# Patient Record
Sex: Female | Born: 1990
Health system: Southern US, Community
[De-identification: ages and names within clinical notes are randomized; demographics above are authoritative.]

## PROBLEM LIST (undated history)

## (undated) DIAGNOSIS — T783XXA Angioneurotic edema, initial encounter: Secondary | ICD-10-CM

## (undated) DIAGNOSIS — I10 Essential (primary) hypertension: Secondary | ICD-10-CM

## (undated) DIAGNOSIS — R002 Palpitations: Secondary | ICD-10-CM

## (undated) DIAGNOSIS — E8881 Metabolic syndrome: Secondary | ICD-10-CM

## (undated) DIAGNOSIS — F419 Anxiety disorder, unspecified: Secondary | ICD-10-CM

## (undated) DIAGNOSIS — E538 Deficiency of other specified B group vitamins: Secondary | ICD-10-CM

## (undated) DIAGNOSIS — R0602 Shortness of breath: Secondary | ICD-10-CM

## (undated) DIAGNOSIS — E88819 Insulin resistance, unspecified: Secondary | ICD-10-CM

## (undated) DIAGNOSIS — E559 Vitamin D deficiency, unspecified: Secondary | ICD-10-CM

## (undated) DIAGNOSIS — K219 Gastro-esophageal reflux disease without esophagitis: Secondary | ICD-10-CM

## (undated) DIAGNOSIS — D229 Melanocytic nevi, unspecified: Secondary | ICD-10-CM

## (undated) DIAGNOSIS — R0789 Other chest pain: Secondary | ICD-10-CM

## (undated) HISTORY — DX: Insulin resistance, unspecified: E88.819

## (undated) HISTORY — PX: TONSILLECTOMY: SUR1361

## (undated) HISTORY — DX: Palpitations: R00.2

## (undated) HISTORY — DX: Angioneurotic edema, initial encounter: T78.3XXA

## (undated) HISTORY — DX: Other chest pain: R07.89

## (undated) HISTORY — PX: SKIN SURGERY: SHX2413

## (undated) HISTORY — PX: MYRINGOTOMY: SUR874

## (undated) HISTORY — DX: Metabolic syndrome: E88.81

## (undated) HISTORY — DX: Essential (primary) hypertension: I10

## (undated) HISTORY — DX: Shortness of breath: R06.02

## (undated) HISTORY — PX: FINGER SURGERY: SHX640

## (undated) HISTORY — PX: ADENOIDECTOMY: SUR15

## (undated) HISTORY — DX: Melanocytic nevi, unspecified: D22.9

## (undated) HISTORY — DX: Vitamin D deficiency, unspecified: E55.9

## (undated) HISTORY — DX: Deficiency of other specified B group vitamins: E53.8

## (undated) HISTORY — DX: Anxiety disorder, unspecified: F41.9

---
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
## 2013-09-12 DIAGNOSIS — D229 Melanocytic nevi, unspecified: Secondary | ICD-10-CM

## 2013-09-12 HISTORY — DX: Melanocytic nevi, unspecified: D22.9

## 2013-10-16 ENCOUNTER — Encounter: Payer: Self-pay | Admitting: Gastroenterology

## 2013-12-01 ENCOUNTER — Ambulatory Visit: Payer: Self-pay | Admitting: Gastroenterology

## 2015-04-07 ENCOUNTER — Emergency Department (HOSPITAL_COMMUNITY)
Admission: EM | Admit: 2015-04-07 | Discharge: 2015-04-08 | Disposition: A | Discharge status: Home / Self Care | Payer: BLUE CROSS/BLUE SHIELD | Attending: Emergency Medicine | Admitting: Emergency Medicine

## 2015-04-07 ENCOUNTER — Emergency Department (HOSPITAL_COMMUNITY): Payer: BLUE CROSS/BLUE SHIELD

## 2015-04-07 ENCOUNTER — Encounter (HOSPITAL_COMMUNITY): Payer: Self-pay | Admitting: *Deleted

## 2015-04-07 DIAGNOSIS — Z3202 Encounter for pregnancy test, result negative: Secondary | ICD-10-CM | POA: Insufficient documentation

## 2015-04-07 DIAGNOSIS — Z79899 Other long term (current) drug therapy: Secondary | ICD-10-CM | POA: Insufficient documentation

## 2015-04-07 DIAGNOSIS — I10 Essential (primary) hypertension: Secondary | ICD-10-CM | POA: Diagnosis not present

## 2015-04-07 DIAGNOSIS — R0602 Shortness of breath: Secondary | ICD-10-CM | POA: Diagnosis present

## 2015-04-07 DIAGNOSIS — N390 Urinary tract infection, site not specified: Secondary | ICD-10-CM | POA: Insufficient documentation

## 2015-04-07 DIAGNOSIS — R000 Tachycardia, unspecified: Secondary | ICD-10-CM | POA: Diagnosis not present

## 2015-04-07 DIAGNOSIS — Z8639 Personal history of other endocrine, nutritional and metabolic disease: Secondary | ICD-10-CM | POA: Insufficient documentation

## 2015-04-07 DIAGNOSIS — R05 Cough: Secondary | ICD-10-CM | POA: Insufficient documentation

## 2015-04-07 DIAGNOSIS — R079 Chest pain, unspecified: Secondary | ICD-10-CM | POA: Diagnosis not present

## 2015-04-07 LAB — CBC
HEMATOCRIT: 39 % (ref 36.0–46.0)
HEMOGLOBIN: 13.2 g/dL (ref 12.0–15.0)
MCH: 27.5 pg (ref 26.0–34.0)
MCHC: 33.8 g/dL (ref 30.0–36.0)
MCV: 81.3 fL (ref 78.0–100.0)
Platelets: 239 10*3/uL (ref 150–400)
RBC: 4.8 MIL/uL (ref 3.87–5.11)
RDW: 12.6 % (ref 11.5–15.5)
WBC: 9.9 10*3/uL (ref 4.0–10.5)

## 2015-04-07 LAB — BASIC METABOLIC PANEL
Anion gap: 14 (ref 5–15)
BUN: 8 mg/dL (ref 6–20)
CHLORIDE: 103 mmol/L (ref 101–111)
CO2: 19 mmol/L — AB (ref 22–32)
Calcium: 9.6 mg/dL (ref 8.9–10.3)
Creatinine, Ser: 0.73 mg/dL (ref 0.44–1.00)
GFR calc non Af Amer: >60 mL/min (ref >60)
Glucose, Bld: 91 mg/dL (ref 65–99)
POTASSIUM: 3.7 mmol/L (ref 3.5–5.1)
SODIUM: 136 mmol/L (ref 135–145)

## 2015-04-07 LAB — I-STAT TROPONIN, ED
TROPONIN I, POC: 0 ng/mL (ref 0.00–0.08)
Troponin i, poc: 0 ng/mL (ref 0.00–0.08)

## 2015-04-07 LAB — POC URINE PREG, ED: Preg Test, Ur: NEGATIVE

## 2015-04-07 NOTE — ED Notes (Signed)
Patient presents stating she stated yesterday feeling like she could not catch her breath.  Stated she could walk to the car but then was SOB.  Also stated she felt tightness in her chest

## 2015-04-07 NOTE — ED Notes (Signed)
Patient does not want family to know if she is pregnant.  PLEASE do not announce with family in the room.  States she takes BCP and they use other protection but wants to make sure no one is told if she is pregnant

## 2015-04-08 ENCOUNTER — Emergency Department (HOSPITAL_COMMUNITY): Payer: BLUE CROSS/BLUE SHIELD

## 2015-04-08 LAB — ACETAMINOPHEN LEVEL: Acetaminophen (Tylenol), Serum: <10 ug/mL — ABNORMAL LOW (ref 10–30)

## 2015-04-08 LAB — URINALYSIS, ROUTINE W REFLEX MICROSCOPIC
BILIRUBIN URINE: NEGATIVE
Glucose, UA: NEGATIVE mg/dL
HGB URINE DIPSTICK: NEGATIVE
Ketones, ur: 15 mg/dL — AB
Nitrite: NEGATIVE
PH: 6 (ref 5.0–8.0)
Protein, ur: NEGATIVE mg/dL
SPECIFIC GRAVITY, URINE: 1.02 (ref 1.005–1.030)
UROBILINOGEN UA: 0.2 mg/dL (ref 0.0–1.0)

## 2015-04-08 LAB — URINE MICROSCOPIC-ADD ON

## 2015-04-08 LAB — SALICYLATE LEVEL

## 2015-04-08 MED ORDER — CEPHALEXIN 500 MG PO CAPS: 500.0000 mg | ORAL_CAPSULE | Freq: Two times a day (BID) | ORAL | Status: DC

## 2015-04-08 MED ORDER — IOHEXOL 350 MG/ML SOLN
100.0000 mL | Freq: Once | INTRAVENOUS | Status: AC | PRN
  Administered 2015-04-08: 100 mL via INTRAVENOUS

## 2015-04-08 MED ORDER — SODIUM CHLORIDE 0.9 % IV BOLUS (SEPSIS)
1000.0000 mL | Freq: Once | INTRAVENOUS | Status: AC
  Administered 2015-04-08: 1000 mL via INTRAVENOUS

## 2015-04-08 NOTE — Discharge Instructions (Signed)
Urinary Tract Infection Summer Flores, your blood work, EKG, and CT scan today did not show a cause for your symptoms.  Your heart rate and blood pressure improved with IV fluids.  You may be dehydrated.  Be sure to drink plenty of water everyday, especially in the heat. Take anti-biotic as directed for urine infection. See a primary care physician within 3 days for close follow-up. If symptoms worsen come back to emergency department immediately. Thank you. A urinary tract infection (UTI) can occur any place along the urinary tract. The tract includes the kidneys, ureters, bladder, and urethra. A type of germ called bacteria often causes a UTI. UTIs are often helped with antibiotic medicine.  HOME CARE   If given, take antibiotics as told by your doctor. Finish them even if you start to feel better.  Drink enough fluids to keep your pee (urine) clear or pale yellow.  Avoid tea, drinks with caffeine, and bubbly (carbonated) drinks.  Pee often. Avoid holding your pee in for a long time.  Pee before and after having sex (intercourse).  Wipe from front to back after you poop (bowel movement) if you are a woman. Use each tissue only once. GET HELP RIGHT AWAY IF:   You have back pain.  You have lower belly (abdominal) pain.  You have chills.  You feel sick to your stomach (nauseous).  You throw up (vomit).  Your burning or discomfort with peeing does not go away.  You have a fever.  Your symptoms are not better in 3 days. MAKE SURE YOU:   Understand these instructions.  Will watch your condition.  Will get help right away if you are not doing well or get worse. Document Released: 01/03/2008 Document Revised: 04/10/2012 Document Reviewed: 02/15/2012 [facility] Patient Information 2015 [place], [place]. This information is not intended to replace advice given to you by your health care provider. Make sure you discuss any questions you have with your health care provider. Chest Pain  (Nonspecific) It is often hard to give a diagnosis for the cause of chest pain. There is always a chance that your pain could be related to something serious, such as a heart attack or a blood clot in the lungs. You need to follow up with your doctor. HOME CARE  If antibiotic medicine was given, take it as directed by your doctor. Finish the medicine even if you start to feel better.  For the next few days, avoid activities that bring on chest pain. Continue physical activities as told by your doctor.  Do not use any tobacco products. This includes cigarettes, chewing tobacco, and e-cigarettes.  Avoid drinking alcohol.  Only take medicine as told by your doctor.  Follow your doctor's suggestions for more testing if your chest pain does not go away.  Keep all doctor visits you made. GET HELP IF:  Your chest pain does not go away, even after treatment.  You have a rash with blisters on your chest.  You have a fever. GET HELP RIGHT AWAY IF:   You have more pain or pain that spreads to your arm, neck, jaw, back, or belly (abdomen).  You have shortness of breath.  You cough more than usual or cough up blood.  You have very bad back or belly pain.  You feel sick to your stomach (nauseous) or throw up (vomit).  You have very bad weakness.  You pass out (faint).  You have chills. This is an emergency. Do not wait to see if  the problems will go away. Call your local emergency services (911 in [place] Do not drive yourself to the hospital. MAKE SURE YOU:   Understand these instructions.  Will watch your condition.  Will get help right away if you are not doing well or get worse. Document Released: 01/03/2008 Document Revised: 07/22/2013 Document Reviewed: 01/03/2008 [facility] Patient Information 2015 [place], [place]. This information is not intended to replace advice given to you by your health care provider. Make sure you discuss any questions you have with your health care  provider.

## 2015-04-08 NOTE — ED Provider Notes (Signed)
CSN: [id number]     Arrival date & time 04/07/15  2051 History  This chart was scribed for Summer Balls, MD by Evelene Croon, ED Scribe. This patient was seen in room D35C/D35C and the patient's care was started 12:10 AM.    Chief Complaint  Patient presents with  . Shortness of Breath  . Chest Tightness     The history is provided by the patient.     HPI Comments:  MAHALA Flores is a 24 y.o. female with a history of HTN who presents to the Emergency Department complaining of central chest tightness for 2 days with associated dry cough, intermittent SOB, swelling to her BLE. Pt wears a fitbit and states her HR increases to 120s during these episodes. She denies fever, sore throat, and rhinorrhea. She denies recent surgery and recent travel but note 12 hour drive in may 6568. Pt is currently on BCP . No alleviating factors noted.   Past Medical History  Diagnosis Date  . Insulin resistance   . Hypertension    Past Surgical History  Procedure Laterality Date  . Tonsillectomy    . Finger surgery    . Myringotomy     No family history on file. Social History  Substance Use Topics  . Smoking status: Never Smoker   . Smokeless tobacco: Never Used  . Alcohol Use: Yes     Comment: socially   OB History    No data available     Review of Systems  A complete 10 system review of systems was obtained and all systems are negative except as noted in the HPI and PMH.    Allergies  Review of patient's allergies indicates no known allergies.  Home Medications   Prior to Admission medications   Medication Sig Start Date End Date Taking? Authorizing Provider  losartan (COZAAR) 100 MG tablet Take 100 mg by mouth daily.   Yes Historical Provider, MD  norethindrone-ethinyl estradiol 1/35 (ORTHO-NOVUM, NORTREL,CYCLAFEM) tablet Take 1 tablet by mouth daily.   Yes Historical Provider, MD   BP 143/90 mmHg  Pulse 114  Temp(Src) 98.7 F (37.1 C) (Oral)  Resp 17  Ht 5\' 6"  (1.676 m)  Wt  231 lb 3.2 oz (104.872 kg)  BMI 37.33 kg/m2  SpO2 100%  LMP 03/28/2015 Physical Exam  Constitutional: She is oriented to person, place, and time. She appears well-developed and well-nourished. No distress.  HENT:  Head: Normocephalic and atraumatic.  Nose: Nose normal.  Mouth/Throat: Oropharynx is clear and moist. No oropharyngeal exudate.  Eyes: Conjunctivae and EOM are normal. Pupils are equal, round, and reactive to light. No scleral icterus.  Neck: Normal range of motion. Neck supple. No JVD present. No tracheal deviation present. No thyromegaly present.  Cardiovascular: Regular rhythm and normal heart sounds.  Tachycardia present.  Exam reveals no gallop and no friction rub.   No murmur heard. Pulmonary/Chest: Effort normal and breath sounds normal. No respiratory distress. She has no wheezes. She exhibits no tenderness.  Abdominal: Soft. Bowel sounds are normal. She exhibits no distension and no mass. There is no tenderness. There is no rebound and no guarding.  Musculoskeletal: Normal range of motion. She exhibits no edema or tenderness.  Lymphadenopathy:    She has no cervical adenopathy.  Neurological: She is alert and oriented to person, place, and time. No cranial nerve deficit. She exhibits normal muscle tone.  Skin: Skin is warm and dry. No rash noted. No erythema. No pallor.  Nursing note and vitals  reviewed.   ED Course  Procedures   DIAGNOSTIC STUDIES:  Oxygen Saturation is 100% on RA, normal by my interpretation.    COORDINATION OF CARE:  12:15 AM Discussed treatment plan with pt at bedside and pt agreed to plan.  Labs Review Labs Reviewed  BASIC METABOLIC PANEL - Abnormal; Notable for the following:    CO2 19 (*)    All other components within normal limits  URINALYSIS, ROUTINE W REFLEX MICROSCOPIC (NOT AT [facility]) - Abnormal; Notable for the following:    APPearance CLOUDY (*)    Ketones, ur 15 (*)    Leukocytes, UA SMALL (*)    All other components within  normal limits  URINE MICROSCOPIC-ADD ON - Abnormal; Notable for the following:    Squamous Epithelial / LPF MANY (*)    Bacteria, UA MANY (*)    All other components within normal limits  CBC  SALICYLATE LEVEL  ACETAMINOPHEN LEVEL  POC URINE PREG, ED  I-STAT TROPOININ, ED  Randolm Idol, ED    Imaging Review Dg Chest 2 View  04/07/2015   CLINICAL DATA:  24 year old female with chest pain and labored breathing  EXAM: CHEST  2 VIEW  COMPARISON:  None.  FINDINGS: The heart size and mediastinal contours are within normal limits. Both lungs are clear. The visualized skeletal structures are unremarkable.  IMPRESSION: No active cardiopulmonary disease.   Electronically Signed   By: Anner Crete M.D.   On: 04/07/2015 21:28   Ct Angio Chest Pe W/cm &/or Wo Cm  04/08/2015   CLINICAL DATA:  Chest pain, shortness of breath, and tachycardia since yesterday.  EXAM: CT ANGIOGRAPHY CHEST WITH CONTRAST  TECHNIQUE: Multidetector CT imaging of the chest was performed using the standard protocol during bolus administration of intravenous contrast. Multiplanar CT image reconstructions and MIPs were obtained to evaluate the vascular anatomy.  CONTRAST:  122mL OMNIPAQUE IOHEXOL 350 MG/ML SOLN  COMPARISON:  None.  FINDINGS: Technically adequate study with good opacification of the central and segmental pulmonary arteries. No focal filling defects demonstrated. No evidence of significant pulmonary embolus.  Normal heart size. Normal caliber thoracic aorta. No aortic dissection. Great vessel origins are patent. Esophagus is decompressed. No significant lymphadenopathy in the chest.  No focal airspace disease or consolidation in the lungs. No pneumothorax. No pleural effusions. Airways appear patent.  Visualized portions of the upper abdominal organs are grossly unremarkable. No destructive bone lesions.  Review of the MIP images confirms the above findings.  IMPRESSION: No evidence of significant pulmonary embolus. No  evidence of active pulmonary disease.   Electronically Signed   By: Lucienne Capers M.D.   On: 04/08/2015 01:23   I have personally reviewed and evaluated these images and lab results as part of my medical decision-making.   EKG Interpretation   Date/Time:  Wednesday April 07 2015 20:58:20 EDT Ventricular Rate:  131 PR Interval:  116 QRS Duration: 96 QT Interval:  396 QTC Calculation: 584 R Axis:   90 Text Interpretation:  Sinus tachycardia Rightward axis Incomplete right  bundle branch block Abnormal ECG Confirmed by Glynn Octave  [phone]) on 04/08/2015 12:00:49 AM      MDM   Final diagnoses:  None   Patient presents emergency department for chest tightness and shortness of breath. I have concern for pulmonary was in. She is tachycardic on exam and has had tachycardia by history over the past couple of days. In addition she had a recent [address] to [place] in May,  patient also takes birth control and is obese. Will obtain CT scan of the chest for evaluation.  Patient was ordered 1 L IV fluid bolus as well.  CTA was negative for pulmonary embolism. Urine does show a mild infection, will treat to see if there is improvement. Primary care follow-up was advised within 3 days. She appears well in no acute distress, tachycardia has resolved with IV fluids. Education given regarding dehydration. Patient is safe for discharge.  I personally performed the services described in this documentation, which was scribed in my presence. The recorded information has been reviewed and is accurate.    Summer Balls, MD 04/08/15 0157

## 2015-04-08 NOTE — ED Notes (Signed)
Pt. Left with all belongings and refused wheelchair. Discharge instructions were reviewed and all questions were answered.  

## 2016-02-07 ENCOUNTER — Other Ambulatory Visit: Payer: Self-pay | Admitting: Nurse Practitioner

## 2016-02-07 DIAGNOSIS — N644 Mastodynia: Secondary | ICD-10-CM

## 2016-02-14 ENCOUNTER — Ambulatory Visit
Admission: RE | Admit: 2016-02-14 | Discharge: 2016-02-14 | Disposition: A | Discharge status: Home / Self Care | Payer: BLUE CROSS/BLUE SHIELD | Source: Ambulatory Visit | Attending: Nurse Practitioner | Admitting: Nurse Practitioner

## 2016-02-14 DIAGNOSIS — N644 Mastodynia: Secondary | ICD-10-CM

## 2016-05-29 IMAGING — DX DG CHEST 2V
2 series · 2 of 2 positions shown · non-contrast
Comparison: None.

CLINICAL DATA: 24-year-old female with chest pain and labored
breathing

EXAM:
CHEST  2 VIEW

[chest pa]
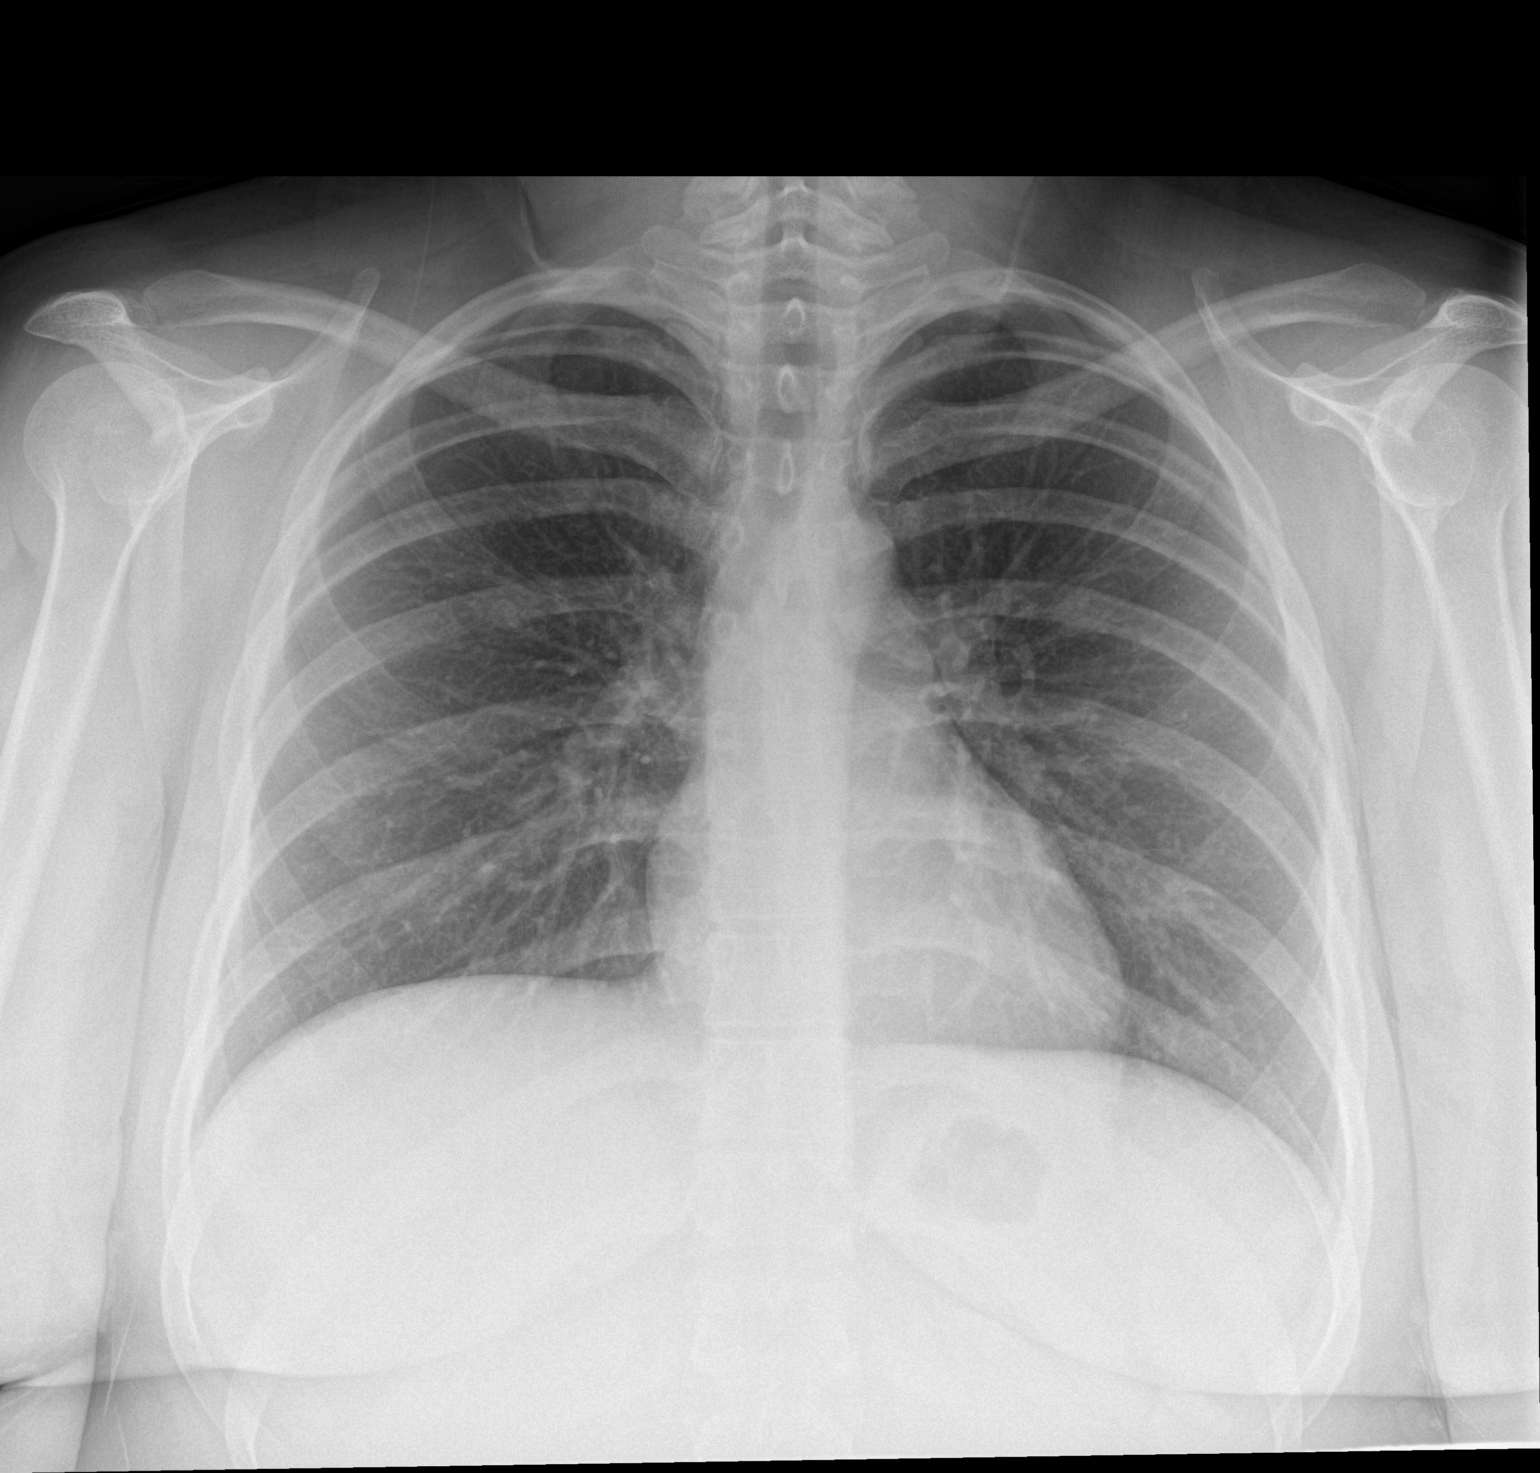

[chest lat]
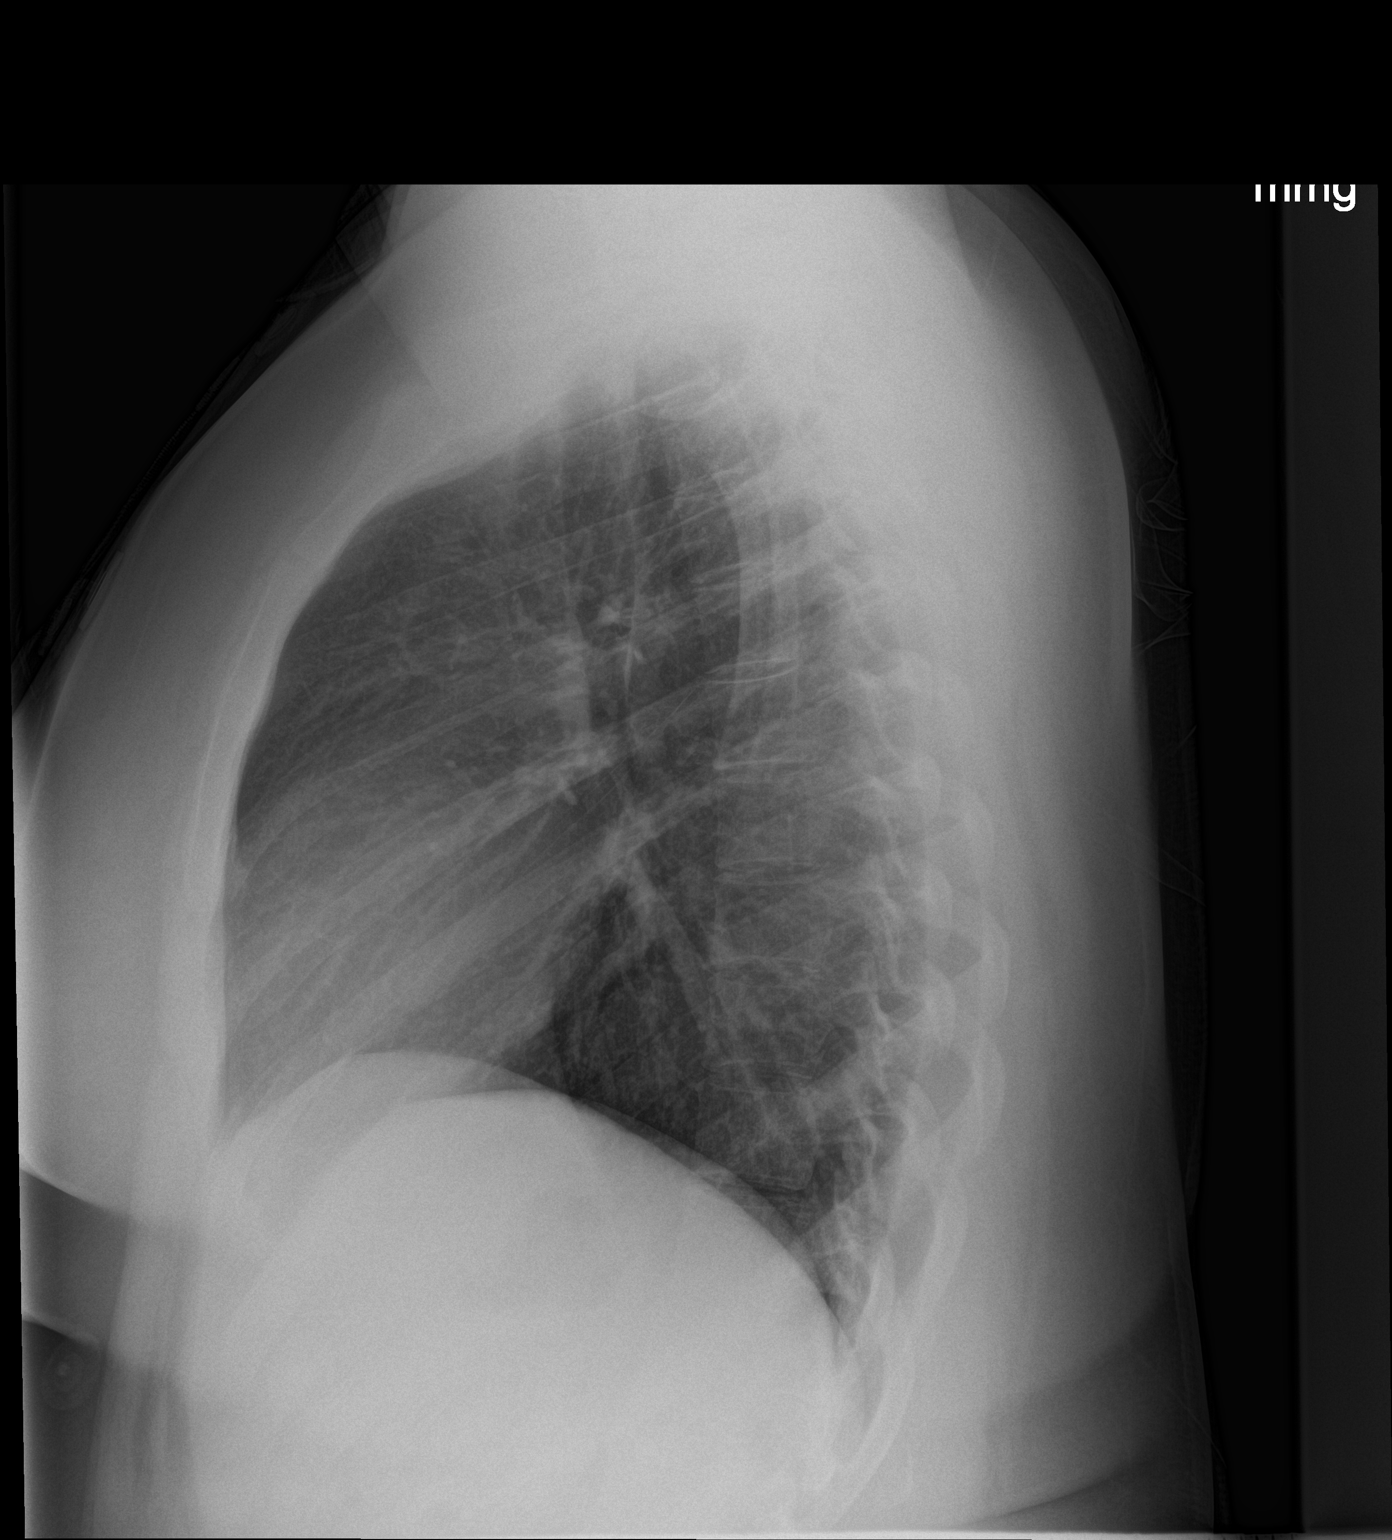

[2 of 2 positions shown; findings below may reference images not displayed]

FINDINGS: The heart size and mediastinal contours are within normal limits.
Both lungs are clear. The visualized skeletal structures are
unremarkable.
IMPRESSION: No active cardiopulmonary disease.

## 2016-08-10 ENCOUNTER — Telehealth: Payer: Self-pay | Admitting: Cardiovascular Disease

## 2016-08-10 NOTE — Telephone Encounter (Signed)
Received records from [facility] for appointment on 09/07/16 with Dr Oval Linsey.  Records put in Dr Blenda Mounts schedule records for 09/07/16. lp

## 2016-09-07 ENCOUNTER — Encounter: Payer: Self-pay | Admitting: Cardiovascular Disease

## 2016-09-07 ENCOUNTER — Ambulatory Visit (INDEPENDENT_AMBULATORY_CARE_PROVIDER_SITE_OTHER): Payer: BLUE CROSS/BLUE SHIELD | Admitting: Cardiovascular Disease

## 2016-09-07 VITALS — BP 128/70 | HR 81 | Ht 66.0 in | Wt 238.2 lb

## 2016-09-07 DIAGNOSIS — I10 Essential (primary) hypertension: Secondary | ICD-10-CM | POA: Diagnosis not present

## 2016-09-07 DIAGNOSIS — R0602 Shortness of breath: Secondary | ICD-10-CM

## 2016-09-07 DIAGNOSIS — R011 Cardiac murmur, unspecified: Secondary | ICD-10-CM | POA: Diagnosis not present

## 2016-09-07 DIAGNOSIS — R0789 Other chest pain: Secondary | ICD-10-CM

## 2016-09-07 NOTE — Patient Instructions (Addendum)
Medication Instructions:  Your physician recommends that you continue on your current medications as directed. Please refer to the Current Medication list given to you today.  Labwork: none  Testing/Procedures: Your physician has requested that you have an echocardiogram. Echocardiography is a painless test that uses sound waves to create images of your heart. It provides your doctor with information about the size and shape of your heart and how well your heart's chambers and valves are working. This procedure takes approximately one hour. There are no restrictions for this procedure. [place]  Your physician has recommended that you have a pulmonary function test. Pulmonary Function Tests are a group of tests that measure how well air moves in and out of your lungs.  Follow-Up: As needed    Echocardiogram An echocardiogram, or echocardiography, uses sound waves (ultrasound) to produce an image of your heart. The echocardiogram is simple, painless, obtained within a short period of time, and offers valuable information to your health care provider. The images from an echocardiogram can provide information such as:  Evidence of coronary artery disease (CAD).  Heart size.  Heart muscle function.  Heart valve function.  Aneurysm detection.  Evidence of a past heart attack.  Fluid buildup around the heart.  Heart muscle thickening.  Assess heart valve function. Tell a health care provider about:  Any allergies you have.  All medicines you are taking, including vitamins, herbs, eye drops, creams, and over-the-counter medicines.  Any problems you or family members have had with anesthetic medicines.  Any blood disorders you have.  Any surgeries you have had.  Any medical conditions you have.  Whether you are pregnant or may be pregnant. What happens before the procedure? No special preparation is needed. Eat and drink normally. What  happens during the procedure?  In order to produce an image of your heart, gel will be applied to your chest and a wand-like tool (transducer) will be moved over your chest. The gel will help transmit the sound waves from the transducer. The sound waves will harmlessly bounce off your heart to allow the heart images to be captured in real-time motion. These images will then be recorded.  You may need an IV to receive a medicine that improves the quality of the pictures. What happens after the procedure? You may return to your normal schedule including diet, activities, and medicines, unless your health care provider tells you otherwise. This information is not intended to replace advice given to you by your health care provider. Make sure you discuss any questions you have with your health care provider. Document Released: 07/14/2000 Document Revised: 03/04/2016 Document Reviewed: 03/24/2013 Elsevier Interactive Patient Education  2017 [employer]   Pulmonary Function Tests Pulmonary function tests (PFTs) measure how well your lungs are working. The tests can help to identify the causes of lung problems. They can also help your health care provider select the best treatment for you. Your health care provider may order pulmonary function for any of the following reasons:  When an illness involving the lungs is suspected.  To follow changes in your lung function over time if you are known to have a chronic lung disease.  For industrial plant workers to examine the effects of being exposed to chemicals over a long period of time.  To assess lung function prior to surgery or other procedures.  For people who are smokers. Your measured lung function will be compared to the expected lung  function of someone with healthy lungs who is similar to you in age, gender, size, and other factors. This is used to determine your "percent predicted" lung function, which is how your health care provider  knows if your lung function is normal or abnormal. If you have had prior pulmonary function testing performed, your health care provider will also compare your current results with past tests to see if your lung function is better, worse, or staying the same. This can sometimes be useful to see if treatments are working.  LET [facility] CARE PROVIDER KNOW ABOUT:  Any allergies you have.  All medicines you are taking, including inhaler or nebulizer medicines, vitamins, herbs, eye drops, creams, and over-the-counter medicines.  Any blood disorders you have.  Previous surgeries you have had, especially recent eye surgery, abdominal surgery, or chest surgery. These can make performing pulmonary function tests difficult or unsafe.  Medical conditions you have.  Chest pain or heart problems.  Tuberculosis or respiratory infections, such as pneumonia, a cold, or the flu. If you think you will have difficulty performing any of the breathing maneuvers, ask your health care provider if you should reschedule the test. RISKS AND COMPLICATIONS: Generally, pulmonary function testing is a safe procedure. However, as with any procedure, complications can occur. Possible complications include:  Lightheadedness due to overbreathing (hyperventilation).  An asthmatic attack from deep breathing. BEFORE THE PROCEDURE  Take medicine as directed by your health care provider. If you take inhaler or nebulizer medicines, ask your health care provider which medicines you should take on the day of your test. Some inhaler medicines may interfere with pulmonary function tests, such as bronchodilator testing, if taken shortly before the test.  Avoid eating a large meal before your test.  Do not smoke before your test.  Wear comfortable clothing which will not interfere with breathing. PROCEDURE  You will be given a soft nose clip to wear during the procedure. This is done so that all of your breaths will go  through your mouth instead of your nose.  You will be given a germ-free (sterile) mouthpiece. It will be attached to a spirometer. The spirometer is the machine that measures your breathing.  You will be instructed to perform various breathing maneuvers. The maneuvers will be done by breathing in (inhaling) and breathing out (exhaling). Depending on what measurements are ordered, you may be asked to repeat the maneuvers several times before the test is completed.  It is important to follow the instructions exactly to obtain accurate results. Make sure to blow as hard and as fast as you can when you are instructed to do so.  You may be given a bronchodilator after testing has been performed. A bronchodilator is a medicine which makes the small air passages in your lungs larger. These medicines usually make it easier to breathe. The tests are then repeated several minutes later after the bronchodilator has taken effect.  You will be monitored carefully during the procedure for faintness, dizziness, difficulty breathing, or any other problems. AFTER THE PROCEDURE   You may resume your usual diet, medicines, and activities as directed by your health care provider.  Your health care provider will go over your test results with you and determine what treatments may be helpful. This information is not intended to replace advice given to you by your health care provider. Make sure you discuss any questions you have with your health care provider. Document Released: 03/09/2004 Document Revised: 05/07/2013 Document Reviewed: 02/13/2013 Elsevier  Interactive Patient Education  2017 [employer].

## 2016-09-07 NOTE — Progress Notes (Signed)
Cardiology Office Note   Date:  09/10/2016   ID:  Summer Flores, DOB 1991-06-23, MRN [id number]  PCP:  No primary care provider on file.  Cardiologist:   Skeet Latch, MD   Chief Complaint  Patient presents with  . New Patient (Initial Visit)    History of Present Illness: Summer Flores is a 26 y.o. female with hypertension who presents for an evaluation of shortness of breath and palpitations.  Ms. Flores saw her PCP on 08/08/16 and reported chest tightness, shortness of breath and palpitations.  EKG at that time was unremarkable.  She has experienced similar symptoms in the past, most recently 04/2015.  At that time she was diagnosed with a UTI and was dehydrated.  When she saw her PCP last month she reported two days of chest tightness and shortness of breath.  The symtpms were worse when laying down.  She also noted her heart racing.  The symptoms were not worse with exertion.  She has not noted any lower extremity edema, orthopnea or PND.   These symptoms lasted for two days.  She had URI symptoms at the time.  There was no associated lightheadedness or dizziness.  She drinks two coffees and several diet sodas daily.  She has a cousin who recently died of an MI at age 68.     Past Medical History:  Diagnosis Date  . Atypical chest pain 09/10/2016  . Hypertension   . Insulin resistance   . Shortness of breath 09/10/2016    Past Surgical History:  Procedure Laterality Date  . FINGER SURGERY    . MYRINGOTOMY    . TONSILLECTOMY       Current Outpatient Prescriptions  Medication Sig Dispense Refill  . losartan (COZAAR) 100 MG tablet Take 100 mg by mouth daily.    . norethindrone-ethinyl estradiol 1/35 (ORTHO-NOVUM, NORTREL,CYCLAFEM) tablet Take 1 tablet by mouth daily.     No current facility-administered medications for this visit.     Allergies:   Patient has no known allergies.    Social History:  The patient  reports that she has never smoked. She has never used  smokeless tobacco. She reports that she drinks alcohol. She reports that she does not use drugs.   Family History:  The patient's family history includes Asthma in her mother; Eczema in her brother; Heart attack in her cousin; Hypertension in her father, maternal grandfather, and paternal grandmother.    ROS:  Please see the history of present illness.   Otherwise, review of systems are positive for none.   All other systems are reviewed and negative.    PHYSICAL EXAM: VS:  BP 128/70   Pulse 81   Ht 5\' 6"  (1.676 m)   Wt 108 kg (238 lb 3.2 oz)   BMI 38.45 kg/m  , BMI Body mass index is 38.45 kg/m. GENERAL:  Well appearing HEENT:  Pupils equal round and reactive, fundi not visualized, oral mucosa unremarkable NECK:  No jugular venous distention, waveform within normal limits, carotid upstroke brisk and symmetric, no bruits, no thyromegaly LYMPHATICS:  No cervical adenopathy LUNGS:  Clear to auscultation bilaterally HEART:  RRR.  PMI not displaced or sustained,S1 and S2 within normal limits, no S3, no S4, no clicks, no rubs, II/VI systolic murmur at LUSB ABD:  Flat, positive bowel sounds normal in frequency in pitch, no bruits, no rebound, no guarding, no midline pulsatile mass, no hepatomegaly, no splenomegaly EXT:  2 plus pulses throughout, no edema,  no cyanosis no clubbing SKIN:  No rashes no nodules NEURO:  Cranial nerves II through XII grossly intact, motor grossly intact throughout PSYCH:  Cognitively intact, oriented to person place and time    EKG:  EKG is ordered today. The ekg ordered today demonstrates sinus rhythm.  Rate 81 bpm.    Recent Labs: No results found for requested labs within last 8760 hours.    Lipid Panel No results found for: CHOL, TRIG, HDL, CHOLHDL, VLDL, LDLCALC, LDLDIRECT    Wt Readings from Last 3 Encounters:  09/07/16 108 kg (238 lb 3.2 oz)  04/07/15 104.9 kg (231 lb 3.2 oz)      ASSESSMENT AND PLAN:  # Atypical chest pain: # Shortness  of breath: Summer Flores symptoms are very atypical.  He recalls having upper respiratory symptoms and wheezing at the time.  I suspect that her symptoms may have been related to asthma/reactive airways.  We will obtian PFTs to assess. We will also get an echo to evaluate for structural heart disease. Risk of ischemia is low given her age and she has no exertional symptoms.  Therefore we will not pursue an ischemia evaluation at this time.  # Obesity: Ms. Summer Flores was encouraged to increase her physical activity to at least 30-40 minutes most days of the week.  # hypertension: BP controlled on losartan.   Current medicines are reviewed at length with the patient today.  The patient does not have concerns regarding medicines.  The following changes have been made:  no change  Labs/ tests ordered today include:   Orders Placed This Encounter  Procedures  . EKG 12-Lead  . ECHOCARDIOGRAM COMPLETE  . Pulmonary function test     Disposition:   FU with Summer Pauli C. Oval Linsey, MD, [facility] as needed    This note was written with the assistance of speech recognition software.  Please excuse any transcriptional errors.  Signed, Sinead Hockman C. Oval Linsey, MD, [facility]  09/10/2016 11:51 PM    [place]

## 2016-09-10 ENCOUNTER — Encounter: Payer: Self-pay | Admitting: Cardiovascular Disease

## 2016-09-10 DIAGNOSIS — R0789 Other chest pain: Secondary | ICD-10-CM

## 2016-09-10 DIAGNOSIS — R0602 Shortness of breath: Secondary | ICD-10-CM

## 2016-09-10 HISTORY — DX: Shortness of breath: R06.02

## 2016-09-10 HISTORY — DX: Other chest pain: R07.89

## 2016-09-25 ENCOUNTER — Other Ambulatory Visit: Payer: Self-pay

## 2016-09-25 ENCOUNTER — Ambulatory Visit (HOSPITAL_BASED_OUTPATIENT_CLINIC_OR_DEPARTMENT_OTHER): Payer: BLUE CROSS/BLUE SHIELD

## 2016-09-25 ENCOUNTER — Encounter (HOSPITAL_COMMUNITY): Payer: BLUE CROSS/BLUE SHIELD

## 2016-09-25 ENCOUNTER — Ambulatory Visit (HOSPITAL_COMMUNITY)
Admission: RE | Admit: 2016-09-25 | Discharge: 2016-09-25 | Disposition: A | Discharge status: Home / Self Care | Payer: BLUE CROSS/BLUE SHIELD | Source: Ambulatory Visit | Attending: Cardiovascular Disease | Admitting: Cardiovascular Disease

## 2016-09-25 DIAGNOSIS — R0602 Shortness of breath: Secondary | ICD-10-CM | POA: Insufficient documentation

## 2016-09-25 DIAGNOSIS — R011 Cardiac murmur, unspecified: Secondary | ICD-10-CM

## 2016-09-25 LAB — PULMONARY FUNCTION TEST
DL/VA % pred: 106 %
DL/VA: 5.35 ml/min/mmHg/L
DLCO COR % PRED: 86 %
DLCO cor: 23.19 ml/min/mmHg
DLCO unc % pred: 86 %
DLCO unc: 23.19 ml/min/mmHg
FEF 25-75 POST: 4.15 L/s
FEF 25-75 Pre: 4.17 L/sec
FEF2575-%Change-Post: 0 %
FEF2575-%Pred-Post: 111 %
FEF2575-%Pred-Pre: 111 %
FEV1-%CHANGE-POST: 1 %
FEV1-%Pred-Post: 100 %
FEV1-%Pred-Pre: 99 %
FEV1-POST: 3.48 L
FEV1-Pre: 3.44 L
FEV1FVC-%CHANGE-POST: 2 %
FEV1FVC-%Pred-Pre: 103 %
FEV6-%Change-Post: -1 %
FEV6-%PRED-PRE: 97 %
FEV6-%Pred-Post: 95 %
FEV6-PRE: 3.9 L
FEV6-Post: 3.84 L
FEV6FVC-%Pred-Post: 100 %
FEV6FVC-%Pred-Pre: 100 %
FVC-%CHANGE-POST: -1 %
FVC-%PRED-POST: 94 %
FVC-%PRED-PRE: 96 %
FVC-POST: 3.84 L
FVC-PRE: 3.9 L
POST FEV6/FVC RATIO: 100 %
PRE FEV1/FVC RATIO: 88 %
PRE FEV6/FVC RATIO: 100 %
Post FEV1/FVC ratio: 91 %
RV % PRED: 104 %
RV: 1.45 L
TLC % PRED: 99 %
TLC: 5.34 L

## 2016-09-25 MED ORDER — ALBUTEROL SULFATE (2.5 MG/3ML) 0.083% IN NEBU
2.5000 mg | INHALATION_SOLUTION | Freq: Once | RESPIRATORY_TRACT | Status: AC
  Administered 2016-09-25: 2.5 mg via RESPIRATORY_TRACT

## 2016-10-02 ENCOUNTER — Telehealth: Payer: Self-pay | Admitting: *Deleted

## 2016-10-02 NOTE — Telephone Encounter (Signed)
-----   Message from Skeet Latch, MD sent at 10/02/2016  8:30 AM EST ----- Normal PFTs.

## 2016-10-02 NOTE — Telephone Encounter (Signed)
Left message to call back  

## 2016-10-04 NOTE — Telephone Encounter (Signed)
Left message to call back  

## 2016-10-06 NOTE — Telephone Encounter (Signed)
Follow up    Pt returning [place] call for lab results

## 2016-10-06 NOTE — Telephone Encounter (Signed)
Pt ntoified 

## 2019-01-28 ENCOUNTER — Other Ambulatory Visit: Payer: BLUE CROSS/BLUE SHIELD

## 2019-01-28 ENCOUNTER — Other Ambulatory Visit: Payer: Self-pay

## 2019-01-28 DIAGNOSIS — Z20822 Contact with and (suspected) exposure to covid-19: Secondary | ICD-10-CM

## 2019-02-01 LAB — NOVEL CORONAVIRUS, NAA: SARS-CoV-2, NAA: NOT DETECTED

## 2019-10-14 ENCOUNTER — Telehealth: Payer: Self-pay | Admitting: *Deleted

## 2019-10-14 NOTE — Telephone Encounter (Signed)
Pathology results given to patient: informed patient we will recheck in 3 months; patient will call and schedule appointment

## 2020-02-23 ENCOUNTER — Ambulatory Visit: Admit: 2020-02-23 | Discharge status: Against Medical Advice | Payer: Self-pay

## 2020-04-13 ENCOUNTER — Ambulatory Visit: Payer: BC Managed Care – PPO | Admitting: Physician Assistant

## 2020-04-13 ENCOUNTER — Other Ambulatory Visit: Payer: Self-pay

## 2020-04-13 ENCOUNTER — Encounter: Payer: Self-pay | Admitting: Physician Assistant

## 2020-04-13 DIAGNOSIS — D485 Neoplasm of uncertain behavior of skin: Secondary | ICD-10-CM

## 2020-04-13 DIAGNOSIS — Z1283 Encounter for screening for malignant neoplasm of skin: Secondary | ICD-10-CM | POA: Diagnosis not present

## 2020-04-13 DIAGNOSIS — Z86018 Personal history of other benign neoplasm: Secondary | ICD-10-CM

## 2020-04-13 NOTE — Patient Instructions (Signed)

## 2020-04-13 NOTE — Progress Notes (Signed)
   Follow-Up Visit   Subjective  Summer Flores is a 29 y.o. female who presents for the following: Annual Exam (right abdomen, right leg- "new').   The following portions of the chart were reviewed this encounter and updated as appropriate: Tobacco  Allergies  Meds  Problems  Med Hx  Surg Hx  Fam Hx      Objective  Well appearing patient in no apparent distress; mood and affect are within normal limits.  A full examination was performed including scalp, head, eyes, ears, nose, lips, neck, chest, axillae, abdomen, back, buttocks, bilateral upper extremities, bilateral lower extremities, hands, feet, fingers, toes, fingernails, and toenails. All findings within normal limits unless otherwise noted below.  Objective  Left Forehead, Left Lower thigh: Scars clear  Objective  Head to toe: No signs of non-mole skin cancer.   Objective  Left Upper side: Bichromic dark nested macule.  Cautery only     Assessment & Plan  History of dysplastic nevus (2) Left Lower thigh; Left Forehead  observe  Screening exam for skin cancer Head to toe  Yearly skin examinations  Neoplasm of uncertain behavior of skin Left Upper side  Epidermal / dermal shaving  Lesion diameter (cm):  0.6 Informed consent: discussed and consent obtained   Timeout: patient name, date of birth, surgical site, and procedure verified   Procedure prep:  Patient was prepped and draped in usual sterile fashion Prep type:  Chlorhexidine Anesthesia: the lesion was anesthetized in a standard fashion   Anesthetic:  1% lidocaine w/ epinephrine 1-100,000 local infiltration Instrument used: DermaBlade   Hemostasis achieved with: aluminum chloride   Outcome: patient tolerated procedure well   Post-procedure details: sterile dressing applied and wound care instructions given   Dressing type: petrolatum gauze, petrolatum and bandage    Specimen 1 - Surgical pathology Differential Diagnosis: DN Check Margins:  yes   I, Shayleigh Bouldin, PA-C, have reviewed all documentation's for this visit.  The documentation on 04/13/20 for the exam, diagnosis, procedures and orders are all accurate and complete.

## 2020-05-19 ENCOUNTER — Telehealth: Payer: Self-pay | Admitting: Physician Assistant

## 2020-05-19 NOTE — Telephone Encounter (Signed)
Results, KRS 

## 2020-05-19 NOTE — Telephone Encounter (Signed)
PATH TO PATIENT NO FOLLOW UP NEEDED

## 2020-09-21 ENCOUNTER — Emergency Department (HOSPITAL_BASED_OUTPATIENT_CLINIC_OR_DEPARTMENT_OTHER): Payer: BC Managed Care – PPO

## 2020-09-21 ENCOUNTER — Other Ambulatory Visit: Payer: Self-pay

## 2020-09-21 ENCOUNTER — Encounter (HOSPITAL_BASED_OUTPATIENT_CLINIC_OR_DEPARTMENT_OTHER): Payer: Self-pay | Admitting: Emergency Medicine

## 2020-09-21 ENCOUNTER — Emergency Department (HOSPITAL_BASED_OUTPATIENT_CLINIC_OR_DEPARTMENT_OTHER)
Admission: EM | Admit: 2020-09-21 | Discharge: 2020-09-21 | Disposition: A | Discharge status: Home / Self Care | Payer: BC Managed Care – PPO | Attending: Emergency Medicine | Admitting: Emergency Medicine

## 2020-09-21 DIAGNOSIS — U099 Post covid-19 condition, unspecified: Secondary | ICD-10-CM | POA: Diagnosis not present

## 2020-09-21 DIAGNOSIS — R000 Tachycardia, unspecified: Secondary | ICD-10-CM | POA: Insufficient documentation

## 2020-09-21 DIAGNOSIS — I10 Essential (primary) hypertension: Secondary | ICD-10-CM | POA: Insufficient documentation

## 2020-09-21 DIAGNOSIS — R42 Dizziness and giddiness: Secondary | ICD-10-CM | POA: Diagnosis present

## 2020-09-21 DIAGNOSIS — Z79899 Other long term (current) drug therapy: Secondary | ICD-10-CM | POA: Insufficient documentation

## 2020-09-21 DIAGNOSIS — R197 Diarrhea, unspecified: Secondary | ICD-10-CM | POA: Diagnosis not present

## 2020-09-21 HISTORY — DX: Gastro-esophageal reflux disease without esophagitis: K21.9

## 2020-09-21 LAB — BASIC METABOLIC PANEL
Anion gap: 13 (ref 5–15)
BUN: 9 mg/dL (ref 6–20)
CO2: 21 mmol/L — ABNORMAL LOW (ref 22–32)
Calcium: 9.7 mg/dL (ref 8.9–10.3)
Chloride: 103 mmol/L (ref 98–111)
Creatinine, Ser: 0.66 mg/dL (ref 0.44–1.00)
GFR, Estimated: >60 mL/min (ref >60)
Glucose, Bld: 113 mg/dL — ABNORMAL HIGH (ref 70–99)
Potassium: 4.1 mmol/L (ref 3.5–5.1)
Sodium: 137 mmol/L (ref 135–145)

## 2020-09-21 LAB — URINALYSIS, ROUTINE W REFLEX MICROSCOPIC
Bilirubin Urine: NEGATIVE
Glucose, UA: NEGATIVE mg/dL
Hgb urine dipstick: NEGATIVE
Ketones, ur: NEGATIVE mg/dL
Leukocytes,Ua: NEGATIVE
Nitrite: NEGATIVE
Protein, ur: NEGATIVE mg/dL
Specific Gravity, Urine: 1.005 (ref 1.005–1.030)
pH: 6.5 (ref 5.0–8.0)

## 2020-09-21 LAB — CBC
HCT: 38.5 % (ref 36.0–46.0)
Hemoglobin: 12.4 g/dL (ref 12.0–15.0)
MCH: 24.8 pg — ABNORMAL LOW (ref 26.0–34.0)
MCHC: 32.2 g/dL (ref 30.0–36.0)
MCV: 76.8 fL — ABNORMAL LOW (ref 80.0–100.0)
Platelets: 365 10*3/uL (ref 150–400)
RBC: 5.01 MIL/uL (ref 3.87–5.11)
RDW: 13.6 % (ref 11.5–15.5)
WBC: 11.2 10*3/uL — ABNORMAL HIGH (ref 4.0–10.5)
nRBC: 0 % (ref 0.0–0.2)

## 2020-09-21 LAB — CBG MONITORING, ED: Glucose-Capillary: 108 mg/dL — ABNORMAL HIGH (ref 70–99)

## 2020-09-21 LAB — PREGNANCY, URINE: Preg Test, Ur: NEGATIVE

## 2020-09-21 MED ORDER — SODIUM CHLORIDE 0.9 % IV BOLUS: 1000.0000 mL | Freq: Once | INTRAVENOUS | Status: DC

## 2020-09-21 NOTE — ED Triage Notes (Signed)
Pt reports having COVID "over two weeks ago"; pt reports today at work she experienced "ear ringing and felt like I was going to pass out"; pt reports having diarrhea since Valentine's day; pt was at the beach this week, had swollen ankles and reports taking BP meds

## 2020-09-21 NOTE — ED Triage Notes (Signed)
Pt ambulatory, gait steady; pt alert and oriented x3, no numbness or weakness or vision changes

## 2020-09-21 NOTE — ED Provider Notes (Signed)
[place] EMERGENCY DEPARTMENT Provider Note   CSN: [id number] Arrival date & time: 09/21/20  1859     History Chief Complaint  Patient presents with  . Dizziness    Summer Flores is a 30 y.o. female here presenting with dizziness and ringing in the years and diarrhea.  Patient was diagnosed with COVID about 2 weeks ago.  Patient states that initially she had some dizziness and has persistent diarrhea for the last week.  She states that happens several times a day.  Denies any vomiting.  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  She states that her heart rate went up to 130s.  She went to urgent care was sent here for further evaluation.  Of note, patient has had elevated D-dimer previously with a negative CTA.  She denies any shortness of breath or palpitations currently  The history is provided by the patient.       Past Medical History:  Diagnosis Date  . Atypical chest pain 09/10/2016  . Atypical nevus 09/12/2013   left forehead (severe), Right mid back (mild), right lower back (mild)  . Atypical nevus 09/09/2014   right outer abdomen (mild), right side neck (mild), left upper arm (moderate),   . Atypical nevus 09/24/2015   left anterior shin (moderate), left outer back (moderate)  . Atypical nevus 09/26/2017   right back outer (mild), left lower thigh (severe), left upper thigh (moderate), left chest (moderate)  . Atypical nevus 09/17/2018   right mid paraspinal (moderate), right inner shin (moderate)  . GERD (gastroesophageal reflux disease)   . Hypertension   . Insulin resistance   . Shortness of breath 09/10/2016    Patient Active Problem List   Diagnosis Date Noted  . Atypical chest pain 09/10/2016  . Shortness of breath 09/10/2016    Past Surgical History:  Procedure Laterality Date  . FINGER SURGERY    . MYRINGOTOMY    . TONSILLECTOMY       OB History   No  obstetric history on file.     Family History  Problem Relation Age of Onset  . Asthma Mother   . Hypertension Father   . Eczema Brother   . Hypertension Paternal Grandmother   . Heart attack Cousin   . Hypertension Maternal Grandfather     Social History   Tobacco Use  . Smoking status: Never Smoker  . Smokeless tobacco: Never Used  Substance Use Topics  . Alcohol use: Yes    Comment: socially  . Drug use: No    Home Medications Prior to Admission medications   Medication Sig Start Date End Date Taking? Authorizing Provider  losartan (COZAAR) 100 MG tablet Take 100 mg by mouth daily.   Yes [provider]  norethindrone-ethinyl estradiol 1/35 (ORTHO-NOVUM, NORTREL,CYCLAFEM) tablet Take 1 tablet by mouth daily.   Yes [provider]  pantoprazole (PROTONIX) 40 MG tablet pantoprazole 40 mg tablet,delayed release  TAKE 1 TABLET BY MOUTH DAILY    [provider]    Allergies    Latex  Review of Systems   Review of Systems  Neurological: Positive for dizziness.  All other systems reviewed and are negative.   Physical Exam Updated Vital Signs BP (!) 153/97   Pulse (!) 106   Temp 98.1 F (36.7 C) (Oral)   Resp 18   Ht 5\' 6"  (1.676 m)   Wt  121.6 kg   SpO2 99%   BMI 43.26 kg/m   Physical Exam Vitals and nursing note reviewed.  Constitutional:      Appearance: Normal appearance.  HENT:     Head: Normocephalic.     Nose: Nose normal.     Mouth/Throat:     Mouth: Mucous membranes are moist.  Eyes:     Extraocular Movements: Extraocular movements intact.     Pupils: Pupils are equal, round, and reactive to light.  Cardiovascular:     Rate and Rhythm: Normal rate and regular rhythm.     Pulses: Normal pulses.     Heart sounds: Normal heart sounds.  Pulmonary:     Effort: Pulmonary effort is normal.     Breath sounds: Normal breath sounds.  Abdominal:     General: Abdomen is flat.     Palpations: Abdomen is soft.   Musculoskeletal:        General: Normal range of motion.     Cervical back: Normal range of motion and neck supple.  Skin:    General: Skin is warm.     Capillary Refill: Capillary refill takes less than 2 seconds.  Neurological:     General: No focal deficit present.     Mental Status: She is alert and oriented to person, place, and time.     Cranial Nerves: No cranial nerve deficit.     Sensory: No sensory deficit.     Motor: No weakness.     Coordination: Coordination normal.  Psychiatric:        Mood and Affect: Mood normal.        Behavior: Behavior normal.     ED Results / Procedures / Treatments   Labs (all labs ordered are listed, but only abnormal results are displayed) Labs Reviewed  BASIC METABOLIC PANEL - Abnormal; Notable for the following components:      Result Value   CO2 21 (*)    Glucose, Bld 113 (*)    All other components within normal limits  CBC - Abnormal; Notable for the following components:   WBC 11.2 (*)    MCV 76.8 (*)    MCH 24.8 (*)    All other components within normal limits  URINALYSIS, ROUTINE W REFLEX MICROSCOPIC - Abnormal; Notable for the following components:   Color, Urine STRAW (*)    All other components within normal limits  CBG MONITORING, ED - Abnormal; Notable for the following components:   Glucose-Capillary 108 (*)    All other components within normal limits  PREGNANCY, URINE    EKG EKG Interpretation  Date/Time:  Tuesday September 21 2020 19:12:43 EST Ventricular Rate:  113 PR Interval:  118 QRS Duration: 94 QT Interval:  344 QTC Calculation: 471 R Axis:   79 Text Interpretation: Sinus tachycardia Otherwise normal ECG rate slower since previous Confirmed by Wandra Arthurs [phone]) on 09/21/2020 9:35:09 PM   Radiology DG Chest 2 View  Result Date: 09/21/2020 CLINICAL DATA:  Weakness EXAM: CHEST - 2 VIEW COMPARISON:  02/23/2020 fall FINDINGS: The heart size and mediastinal contours are within normal limits. Both  lungs are clear. The visualized skeletal structures are unremarkable. IMPRESSION: No active cardiopulmonary disease. Electronically Signed   By: Donavan Foil M.D.   On: 09/21/2020 22:31   CT Head Wo Contrast  Result Date: 09/21/2020 CLINICAL DATA:  Dizziness EXAM: CT HEAD WITHOUT CONTRAST TECHNIQUE: Contiguous axial images were obtained from the base of the skull through the vertex without intravenous  contrast. COMPARISON:  None. FINDINGS: Brain: No evidence of acute infarction, hemorrhage, hydrocephalus, extra-axial collection or mass lesion/mass effect. Vascular: No hyperdense vessel or unexpected calcification. Skull: Normal. Negative for fracture or focal lesion. Sinuses/Orbits: No acute finding. Other: None IMPRESSION: Negative non contrasted CT appearance of the brain. Electronically Signed   By: Donavan Foil M.D.   On: 09/21/2020 22:28    Procedures Procedures   Medications Ordered in ED Medications - No data to display  ED Course  I have reviewed the triage vital signs and the nursing notes.  Pertinent labs & imaging results that were available during my care of the patient were reviewed by me and considered in my medical decision making (see chart for details).    MDM Rules/Calculators/A&P                         Summer Flores is a 30 y.o. female here presenting with dizziness and diarrhea.  Patient had Covid over 2 weeks ago but at this point may have long Covid symptoms.  Plan to get CBC, CMP, CT head, chest x-ray  11:15 PM Labs unremarkable, CT head unremarkable.  We will have her follow-up with the Covid clinic.  I wonder if she is developing long COVID   Final Clinical Impression(s) / ED Diagnoses Final diagnoses:  None    Rx / DC Orders ED Discharge Orders    None       Drenda Freeze, MD 09/21/20 2315

## 2020-09-21 NOTE — ED Notes (Signed)
ED Provider at bedside. 

## 2020-09-21 NOTE — Discharge Instructions (Signed)
Follow-up with post-COVID clinic.  You may continue to take Imodium up to 10 times a day for diarrhea  See your doctor for follow-up as well  Return to ER if you have worse vertigo, diarrhea, vomiting, dizziness.

## 2020-09-22 ENCOUNTER — Telehealth: Payer: Self-pay | Admitting: Nurse Practitioner

## 2020-09-22 ENCOUNTER — Telehealth: Payer: Self-pay | Admitting: *Deleted

## 2020-09-22 NOTE — Telephone Encounter (Signed)
Patient verified DOB Patient was diagnosed with COVID on 09/05/20. Patient reports on 2/14 she began having diarrhea 5-6 times a day. Patient reports this occurring everyday since including today. Patient has been scheduled for virtual COVID+ FU on 09/30/20 at 9:00am.

## 2020-09-22 NOTE — Telephone Encounter (Signed)
Pt was called Vm was left for Pt to call rhe [facility] if she needs any assistance from her Post Covid Syndrome

## 2020-09-30 ENCOUNTER — Telehealth (INDEPENDENT_AMBULATORY_CARE_PROVIDER_SITE_OTHER): Payer: BC Managed Care – PPO | Admitting: Nurse Practitioner

## 2020-09-30 DIAGNOSIS — U071 COVID-19: Secondary | ICD-10-CM | POA: Diagnosis not present

## 2020-09-30 NOTE — Progress Notes (Signed)
Virtual Visit via Telephone Note  I connected with Summer Flores on 09/30/20 at  9:00 AM EST by telephone and verified that I am speaking with the correct person using two identifiers.  Location: Patient: home Provider: office   I discussed the limitations, risks, security and privacy concerns of performing an evaluation and management service by telephone and the availability of in person appointments. I also discussed with the patient that there may be a patient responsible charge related to this service. The patient expressed understanding and agreed to proceed.   History of Present Illness:  Patient presents today for post COVID care clinic visit.  Patient started having diarrhea on 09/13/2018.  She was seen in the ED on 09/21/2020.  Patient did have chest x-ray and CT of her head which were both negative.  She has followed up with her PCP on 09/24/2020 did have lab work completed which showed low B12 levels.  Patient is on replacement therapy.  Patient states that her bowel movements have returned to normal at this point.  Her last BM was on 09/29/2020 and was normal.  She denies any abdominal pain.  She has been compliant with brats diet.  She states that she is staying well-hydrated.  She denies any recent fever.    Observations/Objective:  Vitals with BMI 09/21/2020 09/21/2020 09/21/2020  Height - - 5\' 6"   Weight - - 268 lbs  BMI - - 14.97  Systolic 026 378 -  Diastolic 89 97 -  Pulse 98 106 -      Assessment and Plan:  Covid 19:   Stay well hydrated  Stay active  Deep breathing exercises  May take tylenol or fever or pain      Follow up:  Follow up in 2 weeks or sooner if needed      I discussed the assessment and treatment plan with the patient. The patient was provided an opportunity to ask questions and all were answered. The patient agreed with the plan and demonstrated an understanding of the instructions.   The patient was advised to call back or seek an  in-person evaluation if the symptoms worsen or if the condition fails to improve as anticipated.  I provided 22 minutes of non-face-to-face time during this encounter.   Fenton Foy, NP

## 2020-09-30 NOTE — Patient Instructions (Signed)
Covid 19:   Stay well hydrated  Stay active  Deep breathing exercises  May take tylenol or fever or pain      Follow up:  Follow up in 2 weeks or sooner if needed

## 2020-10-01 DIAGNOSIS — U071 COVID-19: Secondary | ICD-10-CM | POA: Insufficient documentation

## 2020-10-05 ENCOUNTER — Ambulatory Visit: Payer: BC Managed Care – PPO | Admitting: Physician Assistant

## 2020-10-05 ENCOUNTER — Encounter: Payer: Self-pay | Admitting: Physician Assistant

## 2020-10-05 ENCOUNTER — Other Ambulatory Visit: Payer: Self-pay

## 2020-10-05 DIAGNOSIS — Z86018 Personal history of other benign neoplasm: Secondary | ICD-10-CM

## 2020-10-05 DIAGNOSIS — Z1283 Encounter for screening for malignant neoplasm of skin: Secondary | ICD-10-CM | POA: Diagnosis not present

## 2020-10-06 ENCOUNTER — Ambulatory Visit (INDEPENDENT_AMBULATORY_CARE_PROVIDER_SITE_OTHER): Payer: BC Managed Care – PPO | Admitting: Nurse Practitioner

## 2020-10-06 VITALS — BP 133/85 | HR 108 | Temp 97.9°F | Resp 18

## 2020-10-06 DIAGNOSIS — R197 Diarrhea, unspecified: Secondary | ICD-10-CM

## 2020-10-06 DIAGNOSIS — R000 Tachycardia, unspecified: Secondary | ICD-10-CM

## 2020-10-06 DIAGNOSIS — U071 COVID-19: Secondary | ICD-10-CM | POA: Diagnosis not present

## 2020-10-06 NOTE — Patient Instructions (Addendum)
Covid 19 Tachycardia Diarrhea:   Stay well hydrated  Stay active  Deep breathing exercises  May take tylenol for fever or pain  Start probiotic  May start Flonase  Will place referral to cardiology  Will place referral to GI   Diarrhea, Adult Diarrhea is when you pass loose and watery poop (stool) often. Diarrhea can make you feel weak and cause you to lose water in your body (get dehydrated). Losing water in your body can cause you to:  Feel tired and thirsty.  Have a dry mouth.  Go pee (urinate) less often. Diarrhea often lasts 2-3 days. However, it can last longer if it is a sign of something more serious. It is important to treat your diarrhea as told by your doctor. Follow these instructions at home: Eating and drinking Follow these instructions as told by your doctor:  Take an ORS (oral rehydration solution). This is a drink that helps you replace fluids and minerals your body lost. It is sold at pharmacies and stores.  Drink plenty of fluids, such as: ? Water. ? Ice chips. ? Diluted fruit juice. ? Low-calorie sports drinks. ? Milk, if you want.  Avoid drinking fluids that have a lot of sugar or caffeine in them.  Eat bland, easy-to-digest foods in small amounts as you are able. These foods include: ? Bananas. ? Applesauce. ? Rice. ? Low-fat (lean) meats. ? Toast. ? Crackers.  Avoid alcohol.  Avoid spicy or fatty foods.      Medicines  Take over-the-counter and prescription medicines only as told by your doctor.  If you were prescribed an antibiotic medicine, take it as told by your doctor. Do not stop using the antibiotic even if you start to feel better. General instructions  Wash your hands often using soap and water. If soap and water are not available, use a hand sanitizer. Others in your home should wash their hands as well. Hands should be washed: ? After using the toilet or changing a diaper. ? Before preparing, cooking, or serving  food. ? While caring for a sick person. ? While visiting someone in a hospital.  Drink enough fluid to keep your pee (urine) pale yellow.  Rest at home while you get better.  Watch your condition for any changes.  Take a warm bath to help with any burning or pain from having diarrhea.  Keep all follow-up visits as told by your doctor. This is important.   Contact a doctor if:  You have a fever.  Your diarrhea gets worse.  You have new symptoms.  You cannot keep fluids down.  You feel light-headed or dizzy.  You have a headache.  You have muscle cramps. Get help right away if:  You have chest pain.  You feel very weak or you pass out (faint).  You have bloody or black poop or poop that looks like tar.  You have very bad pain, cramping, or bloating in your belly (abdomen).  You have trouble breathing or you are breathing very quickly.  Your heart is beating very quickly.  Your skin feels cold and clammy.  You feel confused.  You have signs of losing too much water in your body, such as: ? Dark pee, very little pee, or no pee. ? Cracked lips. ? Dry mouth. ? Sunken eyes. ? Sleepiness. ? Weakness. Summary  Diarrhea is when you pass loose and watery poop (stool) often.  Diarrhea can make you feel weak and cause you to lose water in  your body (get dehydrated).  Take an ORS (oral rehydration solution). This is a drink that is sold at pharmacies and stores.  Eat bland, easy-to-digest foods in small amounts as you are able.  Contact a doctor if your condition gets worse. Get help right away if you have signs that you have lost too much water in your body. This information is not intended to replace advice given to you by your health care provider. Make sure you discuss any questions you have with your health care provider. Document Revised: 12/21/2017 Document Reviewed: 12/21/2017 Elsevier Patient Education  [employer].   Follow up:  Follow up in 2  weeks or sooner if needed

## 2020-10-06 NOTE — Progress Notes (Signed)
@Patient  ID: Summer Flores, female    DOB: 02/11/1991, 30 y.o.   MRN: [id number]  Chief Complaint  Patient presents with  . Follow-up    Dizziness, diarrhea, elevated heart rate    Referring provider: Sandi Mariscal, MD    HPI  Patient presents today for post COVID care clinic visit.  This is a follow-up visit.  Patient was last seen in our office on 09/30/2020 through televisit.  Patient tested positive for Covid on 09/05/2020.  She states that she is having episodes of heart racing, dizziness, diarrhea.  She also complains of ongoing sinus congestion and postnasal drip.    Allergies  Allergen Reactions  . Latex      There is no immunization history on file for this patient.  Past Medical History:  Diagnosis Date  . Atypical chest pain 09/10/2016  . Atypical nevus 09/12/2013   left forehead (severe), Right mid back (mild), right lower back (mild)  . Atypical nevus 09/09/2014   right outer abdomen (mild), right side neck (mild), left upper arm (moderate),   . Atypical nevus 09/24/2015   left anterior shin (moderate), left outer back (moderate)  . Atypical nevus 09/26/2017   right back outer (mild), left lower thigh (severe), left upper thigh (moderate), left chest (moderate)  . Atypical nevus 09/17/2018   right mid paraspinal (moderate), right inner shin (moderate)  . GERD (gastroesophageal reflux disease)   . Hypertension   . Insulin resistance   . Shortness of breath 09/10/2016    Tobacco History: Social History   Tobacco Use  Smoking Status Never Smoker  Smokeless Tobacco Never Used   Counseling given: Yes   Outpatient Encounter Medications as of 10/06/2020  Medication Sig  . fluconazole (DIFLUCAN) 150 MG tablet Take 150 mg by mouth daily.  Marland Kitchen losartan (COZAAR) 100 MG tablet Take 100 mg by mouth daily.  Marland Kitchen losartan-hydrochlorothiazide (HYZAAR) 100-25 MG tablet Take 1 tablet by mouth daily.  . norethindrone-ethinyl estradiol (CYCLAFEM) 0.5/0.75/1-35 MG-MCG tablet  Nortrel 1/35 (28) 1 mg-35 mcg tablet  Take 1 tablet every day by oral route.  . norethindrone-ethinyl estradiol 1/35 (ORTHO-NOVUM, NORTREL,CYCLAFEM) tablet Take 1 tablet by mouth daily.  . pantoprazole (PROTONIX) 40 MG tablet pantoprazole 40 mg tablet,delayed release  TAKE 1 TABLET BY MOUTH DAILY  . vitamin B-12 (CYANOCOBALAMIN) 1000 MCG tablet Take 1,000 mcg by mouth daily.  . Vitamin D, Ergocalciferol, (DRISDOL) 1.25 MG (50000 UNIT) CAPS capsule Take 50,000 Units by mouth once a week.   No facility-administered encounter medications on file as of 10/06/2020.     Review of Systems  Review of Systems  Constitutional: Negative.  Negative for fever.  HENT: Negative.   Respiratory: Negative for cough and shortness of breath.   Cardiovascular: Positive for palpitations.  Gastrointestinal: Positive for diarrhea.  Allergic/Immunologic: Negative.   Neurological: Negative.   Psychiatric/Behavioral: Negative.        Physical Exam  BP 133/85   Pulse (!) 108   Temp 97.9 F (36.6 C)   Resp 18   SpO2 100% Comment: RA  Wt Readings from Last 5 Encounters:  09/21/20 268 lb (121.6 kg)  09/07/16 238 lb 3.2 oz (108 kg)  04/07/15 231 lb 3.2 oz (104.9 kg)     Physical Exam Vitals and nursing note reviewed.  Constitutional:      General: She is not in acute distress.    Appearance: She is well-developed.  Cardiovascular:     Rate and Rhythm: Regular rhythm. Tachycardia present.  Pulmonary:  Effort: Pulmonary effort is normal.     Breath sounds: Normal breath sounds.  Abdominal:     General: There is no distension.     Tenderness: There is no abdominal tenderness.  Neurological:     Mental Status: She is alert and oriented to person, place, and time.  Psychiatric:        Mood and Affect: Mood normal.        Behavior: Behavior normal.      Imaging: DG Chest 2 View  Result Date: 09/21/2020 CLINICAL DATA:  Weakness EXAM: CHEST - 2 VIEW COMPARISON:  02/23/2020 fall FINDINGS:  The heart size and mediastinal contours are within normal limits. Both lungs are clear. The visualized skeletal structures are unremarkable. IMPRESSION: No active cardiopulmonary disease. Electronically Signed   By: Donavan Foil M.D.   On: 09/21/2020 22:31   CT Head Wo Contrast  Result Date: 09/21/2020 CLINICAL DATA:  Dizziness EXAM: CT HEAD WITHOUT CONTRAST TECHNIQUE: Contiguous axial images were obtained from the base of the skull through the vertex without intravenous contrast. COMPARISON:  None. FINDINGS: Brain: No evidence of acute infarction, hemorrhage, hydrocephalus, extra-axial collection or mass lesion/mass effect. Vascular: No hyperdense vessel or unexpected calcification. Skull: Normal. Negative for fracture or focal lesion. Sinuses/Orbits: No acute finding. Other: None IMPRESSION: Negative non contrasted CT appearance of the brain. Electronically Signed   By: Donavan Foil M.D.   On: 09/21/2020 22:28     Assessment & Plan:   COVID-19 Covid 19 Tachycardia Diarrhea:   Stay well hydrated  Stay active  Deep breathing exercises  May take tylenol for fever or pain  Start probiotic  May start Flonase  Will place referral to cardiology  Will place referral to [place], NP 10/07/2020

## 2020-10-07 LAB — COMPREHENSIVE METABOLIC PANEL
ALT: 22 IU/L (ref 0–32)
AST: 17 IU/L (ref 0–40)
Albumin/Globulin Ratio: 1.5 (ref 1.2–2.2)
Albumin: 4.2 g/dL (ref 3.9–5.0)
Alkaline Phosphatase: 75 IU/L (ref 44–121)
BUN/Creatinine Ratio: 16 (ref 9–23)
BUN: 11 mg/dL (ref 6–20)
Bilirubin Total: 0.3 mg/dL (ref 0.0–1.2)
CO2: 20 mmol/L (ref 20–29)
Calcium: 9.5 mg/dL (ref 8.7–10.2)
Chloride: 101 mmol/L (ref 96–106)
Creatinine, Ser: 0.69 mg/dL (ref 0.57–1.00)
Globulin, Total: 2.8 g/dL (ref 1.5–4.5)
Glucose: 80 mg/dL (ref 65–99)
Potassium: 4.3 mmol/L (ref 3.5–5.2)
Sodium: 141 mmol/L (ref 134–144)
Total Protein: 7 g/dL (ref 6.0–8.5)
eGFR: 120 mL/min/{1.73_m2} (ref >59)

## 2020-10-07 LAB — CBC
Hematocrit: 38.7 % (ref 34.0–46.6)
Hemoglobin: 12.1 g/dL (ref 11.1–15.9)
MCH: 24.4 pg — ABNORMAL LOW (ref 26.6–33.0)
MCHC: 31.3 g/dL — ABNORMAL LOW (ref 31.5–35.7)
MCV: 78 fL — ABNORMAL LOW (ref 79–97)
Platelets: 250 10*3/uL (ref 150–450)
RBC: 4.96 x10E6/uL (ref 3.77–5.28)
RDW: 13.5 % (ref 11.7–15.4)
WBC: 8.2 10*3/uL (ref 3.4–10.8)

## 2020-10-07 NOTE — Assessment & Plan Note (Signed)
Covid 19 Tachycardia Diarrhea:   Stay well hydrated  Stay active  Deep breathing exercises  May take tylenol for fever or pain  Start probiotic  May start Flonase  Will place referral to cardiology  Will place referral to GI

## 2020-10-19 ENCOUNTER — Encounter: Payer: Self-pay | Admitting: Physician Assistant

## 2020-10-19 NOTE — Progress Notes (Signed)
   Follow-Up Visit   Subjective  Summer Flores is a 30 y.o. female who presents for the following: Follow-up (Patient here today for her 6 month skin check. No new concerns. Per patient the pigment is coming back in the mole that was removed at her last visit on her left upper side.).   The following portions of the chart were reviewed this encounter and updated as appropriate:  Tobacco  Allergies  Meds  Problems  Med Hx  Surg Hx  Fam Hx      Objective  Well appearing patient in no apparent distress; mood and affect are within normal limits.  A full examination was performed including scalp, head, eyes, ears, nose, lips, neck, chest, axillae, abdomen, back, buttocks, bilateral upper extremities, bilateral lower extremities, hands, feet, fingers, toes, fingernails, and toenails. All findings within normal limits unless otherwise noted below.  Objective  head to toe: Full body skin examination- no atypical moles or non mole skin cancer  Objective  Right Hip, left temple/hairline: No atypical nevi No signs of non-mole skin cancer. Multiple white scars- clear   Assessment & Plan  Encounter for screening for malignant neoplasm of skin head to toe  Yearly skin check  History of dysplastic nevus Right Hip, left temple/hairline  Yearly skin check    I, Judy Goodenow, PA-C, have reviewed all documentation's for this visit.  The documentation on 10/19/20 for the exam, diagnosis, procedures and orders are all accurate and complete.

## 2020-10-20 ENCOUNTER — Other Ambulatory Visit: Payer: Self-pay | Admitting: Physician Assistant

## 2020-10-20 DIAGNOSIS — R109 Unspecified abdominal pain: Secondary | ICD-10-CM

## 2020-10-28 NOTE — Progress Notes (Deleted)
Cardiology Office Note:    Date:  10/29/2020   ID:  Summer Flores, DOB 08/27/1990, MRN [id number]  PCP:  Ralph Leyden, FNP  Cardiologist:  No primary care provider on file.  Electrophysiologist:  None   Referring MD: Fenton Foy, NP   Chief Complaint  Patient presents with  . New Patient (Initial Visit)  . Headache  . Chest Pain    Discomfort.  . Shortness of Breath  ***  History of Present Illness:    Summer Flores is a 30 y.o. female with a hx of recent COVID-19 infection who is referred by Lazaro Arms, NP for evaluation of tachycardia.  She tested positive for COVID-19 on 09/05/2020.    Echocardiogram on 09/25/2016 showed normal biventricular function, no significant valvular disease.  Past Medical History:  Diagnosis Date  . Atypical chest pain 09/10/2016  . Atypical nevus 09/12/2013   left forehead (severe), Right mid back (mild), right lower back (mild)  . Atypical nevus 09/09/2014   right outer abdomen (mild), right side neck (mild), left upper arm (moderate),   . Atypical nevus 09/24/2015   left anterior shin (moderate), left outer back (moderate)  . Atypical nevus 09/26/2017   right back outer (mild), left lower thigh (severe), left upper thigh (moderate), left chest (moderate)  . Atypical nevus 09/17/2018   right mid paraspinal (moderate), right inner shin (moderate)  . GERD (gastroesophageal reflux disease)   . Hypertension   . Insulin resistance   . Shortness of breath 09/10/2016    Past Surgical History:  Procedure Laterality Date  . FINGER SURGERY    . MYRINGOTOMY    . TONSILLECTOMY      Current Medications: Current Meds  Medication Sig  . Ascorbic Acid (VITAMIN C PO) Take 1 tablet by mouth daily.  . ferrous sulfate 325 (65 FE) MG tablet Take 325 mg by mouth daily with breakfast.  . fluconazole (DIFLUCAN) 150 MG tablet Take 150 mg by mouth daily.  Marland Kitchen losartan-hydrochlorothiazide (HYZAAR) 100-25 MG tablet Take 1 tablet by mouth daily.  .  norethindrone-ethinyl estradiol (CYCLAFEM) 0.5/0.75/1-35 MG-MCG tablet Nortrel 1/35 (28) 1 mg-35 mcg tablet  Take 1 tablet every day by oral route.  . norethindrone-ethinyl estradiol 1/35 (ORTHO-NOVUM, NORTREL,CYCLAFEM) tablet Take 1 tablet by mouth daily.  . pantoprazole (PROTONIX) 40 MG tablet pantoprazole 40 mg tablet,delayed release  TAKE 1 TABLET BY MOUTH DAILY  . predniSONE (DELTASONE) 10 MG tablet Take 10 mg by mouth daily with breakfast.  . Probiotic Product (PROBIOTIC DAILY PO) Take 1 capsule by mouth daily.  . vitamin B-12 (CYANOCOBALAMIN) 1000 MCG tablet Take 1,000 mcg by mouth daily.  . Vitamin D, Ergocalciferol, (DRISDOL) 1.25 MG (50000 UNIT) CAPS capsule Take 50,000 Units by mouth once a week.  . [DISCONTINUED] losartan (COZAAR) 100 MG tablet Take 100 mg by mouth daily.     Allergies:   Latex   Social History   Socioeconomic History  . Marital status: Single    Spouse name: Not on file  . Number of children: Not on file  . Years of education: Not on file  . Highest education level: Not on file  Occupational History  . Not on file  Tobacco Use  . Smoking status: Never Smoker  . Smokeless tobacco: Never Used  Substance and Sexual Activity  . Alcohol use: Yes    Comment: socially  . Drug use: No  . Sexual activity: Not on file  Other Topics Concern  . Not on file  Social History Narrative  . Not on file   Social Determinants of Health   Financial Resource Strain: Not on file  Food Insecurity: Not on file  Transportation Needs: Not on file  Physical Activity: Not on file  Stress: Not on file  Social Connections: Not on file     Family History: The patient's ***family history includes Asthma in her mother; Eczema in her brother; Heart attack in her cousin; Hypertension in her father, maternal grandfather, and paternal grandmother.  ROS:   Please see the history of present illness.    *** All other systems reviewed and are negative.  EKGs/Labs/Other  Studies Reviewed:    The following studies were reviewed today: ***  EKG:  EKG is *** ordered today.  The ekg ordered today demonstrates ***  Recent Labs: 10/06/2020: ALT 22; BUN 11; Creatinine, Ser 0.69; Hemoglobin 12.1; Platelets 250; Potassium 4.3; Sodium 141  Recent Lipid Panel No results found for: CHOL, TRIG, HDL, CHOLHDL, VLDL, LDLCALC, LDLDIRECT  Physical Exam:    VS:  BP 124/68 (BP Location: Left Arm, Patient Position: Sitting, Cuff Size: Large)   Pulse 92   Ht 5\' 6"  (1.676 m)   Wt 270 lb (122.5 kg)   BMI 43.58 kg/m     Wt Readings from Last 3 Encounters:  10/29/20 270 lb (122.5 kg)  09/21/20 268 lb (121.6 kg)  09/07/16 238 lb 3.2 oz (108 kg)     GEN: *** Well nourished, well developed in no acute distress HEENT: Normal NECK: No JVD; No carotid bruits LYMPHATICS: No lymphadenopathy CARDIAC: ***RRR, no murmurs, rubs, gallops RESPIRATORY:  Clear to auscultation without rales, wheezing or rhonchi  ABDOMEN: Soft, non-tender, non-distended MUSCULOSKELETAL:  No edema; No deformity  SKIN: Warm and dry NEUROLOGIC:  Alert and oriented x 3 PSYCHIATRIC:  Normal affect   ASSESSMENT:    1. Near syncope   2. Essential hypertension    PLAN:    Near syncope: Reports episodes of tachycardia and near syncope since COVID-19 infection.  Will check echocardiogram to rule out myocardial involvement from Covid.  PCP already ordered Zio patch x2 weeks, will follow up results to evaluate for arrhythmia.  Hypertension: On losartan-HCTZ 100-25 mg daily.  Appears controlled  RTC in 6 to 8 weeks  Medication Adjustments/Labs and Tests Ordered: Current medicines are reviewed at length with the patient today.  Concerns regarding medicines are outlined above.  No orders of the defined types were placed in this encounter.  No orders of the defined types were placed in this encounter.   There are no Patient Instructions on file for this visit.   Signed, Donato Heinz, MD   10/29/2020 11:21 AM    [place]

## 2020-10-29 ENCOUNTER — Encounter: Payer: Self-pay | Admitting: Cardiology

## 2020-10-29 ENCOUNTER — Ambulatory Visit: Payer: BC Managed Care – PPO | Admitting: Cardiology

## 2020-10-29 ENCOUNTER — Other Ambulatory Visit: Payer: Self-pay

## 2020-10-29 VITALS — BP 124/68 | HR 92 | Ht 66.0 in | Wt 270.0 lb

## 2020-10-29 DIAGNOSIS — R55 Syncope and collapse: Secondary | ICD-10-CM

## 2020-10-29 DIAGNOSIS — R002 Palpitations: Secondary | ICD-10-CM

## 2020-10-29 DIAGNOSIS — I10 Essential (primary) hypertension: Secondary | ICD-10-CM

## 2020-10-29 NOTE — Patient Instructions (Signed)
Medication Instructions:  Continue same medications   Lab Work: None ordered   Testing/Procedures: Echo   Follow-Up: At [facility], you and your health needs are our priority.  As part of our continuing mission to provide you with exceptional heart care, we have created designated Provider Care Teams.  These Care Teams include your primary Cardiologist (physician) and Advanced Practice Providers (APPs -  Physician Assistants and Nurse Practitioners) who all work together to provide you with the care you need, when you need it.  We recommend signing up for the patient portal called "MyChart".  Sign up information is provided on this After Visit Summary.  MyChart is used to connect with patients for Virtual Visits (Telemedicine).  Patients are able to view lab/test results, encounter notes, upcoming appointments, etc.  Non-urgent messages can be sent to your provider as well.   To learn more about what you can do with MyChart, go to [email or web address].    Your next appointment:  6 to 8 weeks   The format for your next appointment: Office   Provider:  Dr.Jordan

## 2020-10-29 NOTE — Progress Notes (Signed)
Cardiology Office Note:    Date:  10/29/2020   ID:  Summer Flores, DOB [birth year], MRN [id number]  PCP:  Ralph Leyden, FNP  Cardiologist:  No primary care provider on file.  Electrophysiologist:  None   Referring MD: Fenton Foy, NP   Chief Complaint  Patient presents with  . New Patient (Initial Visit)  . Headache  . Chest Pain    Discomfort.  . Shortness of Breath    History of Present Illness:    Summer Flores is a 30 y.o. female with a hx of recent COVID-19 infection who is referred by Summer Arms, NP for evaluation of tachycardia.  She is accompanied by her mother at this visit. She tested positive for COVID-19 on 09/05/2020. She then developed frequent episodes of diarrhea on 09/13/2020. She went to the ER to be evaulated and an echocardiogram was taken on 09/25/2016 showed normal biventricular function, no significant valvular disease. After her ER visit she has been having a rapid HR of 150, fatigue, and dizzy spells. She often feels like her face and arm are numb. When she is having the dizzy spells she feels like the room is closing in on her. She denies feeling sweaty when the episode occurs. The episodes have occurred 3 times since her COVID dx. One episode happened while sitting in her office chair at home and one while sitting on her couch while watching TV. She has not had a syncope episode but feels like she is going to. She reports that her heartbeat racing is accompanied by her having GI issues. She notes feeling like she is having brain fog. She has not been experience SOB but sometimes gets out of breathing when talking at work. She also has some LE edema, even when her blood pressure is normal. After COVID her blood pressure has been ranging fluctuating in the 160 /90 range. She does not smoke. She denies having any PND or palpitations. No known family history of heart disease.    Past Medical History:  Diagnosis Date  . Atypical chest pain 09/10/2016  .  Atypical nevus 09/12/2013   left forehead (severe), Right mid back (mild), right lower back (mild)  . Atypical nevus 09/09/2014   right outer abdomen (mild), right side neck (mild), left upper arm (moderate),   . Atypical nevus 09/24/2015   left anterior shin (moderate), left outer back (moderate)  . Atypical nevus 09/26/2017   right back outer (mild), left lower thigh (severe), left upper thigh (moderate), left chest (moderate)  . Atypical nevus 09/17/2018   right mid paraspinal (moderate), right inner shin (moderate)  . GERD (gastroesophageal reflux disease)   . Hypertension   . Insulin resistance   . Shortness of breath 09/10/2016    Past Surgical History:  Procedure Laterality Date  . FINGER SURGERY    . MYRINGOTOMY    . TONSILLECTOMY      Current Medications: Current Meds  Medication Sig  . Ascorbic Acid (VITAMIN C PO) Take 1 tablet by mouth daily.  . ferrous sulfate 325 (65 FE) MG tablet Take 325 mg by mouth daily with breakfast.  . fluconazole (DIFLUCAN) 150 MG tablet Take 150 mg by mouth daily.  Marland Kitchen losartan-hydrochlorothiazide (HYZAAR) 100-25 MG tablet Take 1 tablet by mouth daily.  . norethindrone-ethinyl estradiol (CYCLAFEM) 0.5/0.75/1-35 MG-MCG tablet Nortrel 1/35 (28) 1 mg-35 mcg tablet  Take 1 tablet every day by oral route.  . norethindrone-ethinyl estradiol 1/35 (ORTHO-NOVUM, NORTREL,CYCLAFEM) tablet Take 1 tablet by mouth  daily.  . pantoprazole (PROTONIX) 40 MG tablet pantoprazole 40 mg tablet,delayed release  TAKE 1 TABLET BY MOUTH DAILY  . predniSONE (DELTASONE) 10 MG tablet Take 10 mg by mouth daily with breakfast.  . Probiotic Product (PROBIOTIC DAILY PO) Take 1 capsule by mouth daily.  . vitamin B-12 (CYANOCOBALAMIN) 1000 MCG tablet Take 1,000 mcg by mouth daily.  . Vitamin D, Ergocalciferol, (DRISDOL) 1.25 MG (50000 UNIT) CAPS capsule Take 50,000 Units by mouth once a week.  . [DISCONTINUED] losartan (COZAAR) 100 MG tablet Take 100 mg by mouth daily.      Allergies:   Latex   Social History   Socioeconomic History  . Marital status: Single    Spouse name: Not on file  . Number of children: Not on file  . Years of education: Not on file  . Highest education level: Not on file  Occupational History  . Not on file  Tobacco Use  . Smoking status: Never Smoker  . Smokeless tobacco: Never Used  Substance and Sexual Activity  . Alcohol use: Yes    Comment: socially  . Drug use: No  . Sexual activity: Not on file  Other Topics Concern  . Not on file  Social History Narrative  . Not on file   Social Determinants of Health   Financial Resource Strain: Not on file  Food Insecurity: Not on file  Transportation Needs: Not on file  Physical Activity: Not on file  Stress: Not on file  Social Connections: Not on file     Family History: The patient's family history includes Asthma in her mother; Eczema in her brother; Heart attack in her cousin; Hypertension in her father, maternal grandfather, and paternal grandmother.  ROS:   Please see the history of present illness.    All other systems reviewed and are negative.  EKGs/Labs/Other Studies Reviewed:    The following studies were reviewed today:   EKG:   4/22-normal sinus rhythm, rate 92, no ST abnormalities  Recent Labs: 10/06/2020: ALT 22; BUN 11; Creatinine, Ser 0.69; Hemoglobin 12.1; Platelets 250; Potassium 4.3; Sodium 141  Recent Lipid Panel No results found for: CHOL, TRIG, HDL, CHOLHDL, VLDL, LDLCALC, LDLDIRECT  Physical Exam:    VS:  BP 124/68 (BP Location: Left Arm, Patient Position: Sitting, Cuff Size: Large)   Pulse 92   Ht 5\' 6"  (1.676 m)   Wt 270 lb (122.5 kg)   BMI 43.58 kg/m     Wt Readings from Last 3 Encounters:  10/29/20 270 lb (122.5 kg)  09/21/20 268 lb (121.6 kg)  09/07/16 238 lb 3.2 oz (108 kg)     WUJ:WJXB nourished, well developed in no acute distress HEENT: Normal NECK: No JVD; No carotid bruits LYMPHATICS: No  lymphadenopathy CARDIAC: RRR, no murmurs, rubs, gallops RESPIRATORY:  Clear to auscultation without rales, wheezing or rhonchi  ABDOMEN: Soft, non-tender, non-distended MUSCULOSKELETAL:  No edema; No deformity  SKIN: Warm and dry NEUROLOGIC:  Alert and oriented x 3 PSYCHIATRIC:  Normal affect   ASSESSMENT:    1. Near syncope   2. Essential hypertension   3. Palpitations    PLAN:    Near syncope: Reports episodes of tachycardia and near syncope since COVID-19 infection.  Will check echocardiogram to rule out myocardial involvement from Covid.  PCP already ordered Zio patch x2 weeks, will follow up results to evaluate for arrhythmia.  Hypertension: On losartan-HCTZ 100-25 mg daily.  Appears controlled  RTC in 6 to 8 weeks  Medication Adjustments/Labs and  Tests Ordered: Current medicines are reviewed at length with the patient today.  Concerns regarding medicines are outlined above.  Orders Placed This Encounter  Procedures  . EKG 12-Lead  . ECHOCARDIOGRAM COMPLETE   No orders of the defined types were placed in this encounter.   Patient Instructions  Medication Instructions:  Continue same medications   Lab Work: None ordered   Testing/Procedures: Echo   Follow-Up: At [facility], you and your health needs are our priority.  As part of our continuing mission to provide you with exceptional heart care, we have created designated Provider Care Teams.  These Care Teams include your primary Cardiologist (physician) and Advanced Practice Providers (APPs -  Physician Assistants and Nurse Practitioners) who all work together to provide you with the care you need, when you need it.  We recommend signing up for the patient portal called "MyChart".  Sign up information is provided on this After Visit Summary.  MyChart is used to connect with patients for Virtual Visits (Telemedicine).  Patients are able to view lab/test results, encounter notes, upcoming appointments, etc.   Non-urgent messages can be sent to your provider as well.   To learn more about what you can do with MyChart, go to [email or web address].    Your next appointment:  6 to 8 weeks   The format for your next appointment: Office   Provider:  Dr.Jordan       I,Alexis Bryant,acting as a scribe for Donato Heinz, MD.,have documented all relevant documentation on the behalf of Donato Heinz, MD,as directed by  Donato Heinz, MD while in the presence of Donato Heinz, MD.  Signed, Donato Heinz, MD  10/29/2020 11:24 AM    [place]

## 2020-11-02 ENCOUNTER — Ambulatory Visit
Admission: RE | Admit: 2020-11-02 | Discharge: 2020-11-02 | Disposition: A | Discharge status: Home / Self Care | Payer: BC Managed Care – PPO | Source: Ambulatory Visit | Attending: Physician Assistant | Admitting: Physician Assistant

## 2020-11-02 ENCOUNTER — Other Ambulatory Visit: Payer: Self-pay

## 2020-11-02 DIAGNOSIS — R109 Unspecified abdominal pain: Secondary | ICD-10-CM

## 2020-11-02 MED ORDER — IOPAMIDOL (ISOVUE-300) INJECTION 61%
100.0000 mL | Freq: Once | INTRAVENOUS | Status: AC | PRN
  Administered 2020-11-02: 100 mL via INTRAVENOUS

## 2020-11-26 ENCOUNTER — Ambulatory Visit: Payer: BC Managed Care – PPO | Admitting: Gastroenterology

## 2020-12-07 ENCOUNTER — Other Ambulatory Visit: Payer: Self-pay

## 2020-12-07 ENCOUNTER — Ambulatory Visit (HOSPITAL_COMMUNITY): Payer: BC Managed Care – PPO | Attending: Cardiology

## 2020-12-07 DIAGNOSIS — R55 Syncope and collapse: Secondary | ICD-10-CM | POA: Insufficient documentation

## 2020-12-07 DIAGNOSIS — I10 Essential (primary) hypertension: Secondary | ICD-10-CM | POA: Diagnosis not present

## 2020-12-07 DIAGNOSIS — R002 Palpitations: Secondary | ICD-10-CM | POA: Diagnosis not present

## 2020-12-07 LAB — ECHOCARDIOGRAM COMPLETE
Area-P 1/2: 3.21 cm2
S' Lateral: 2.7 cm

## 2020-12-07 MED ORDER — PERFLUTREN LIPID MICROSPHERE
1.0000 mL | INTRAVENOUS | Status: AC | PRN
  Administered 2020-12-07: 2 mL via INTRAVENOUS

## 2020-12-09 NOTE — Progress Notes (Deleted)
Cardiology Office Note:    Date:  12/09/2020   ID:  Summer Flores, DOB 10-Jan-1991, MRN [id number]  PCP:  Lavella Lemons, PA  Cardiologist:  None  Electrophysiologist:  None   Referring MD: Ralph Leyden, FNP   No chief complaint on file.   History of Present Illness:    Summer Flores is a 30 y.o. female with a hx of recent COVID-19 infection who presents for follow-up.  She was referred by Lazaro Arms, NP for evaluation of tachycardia, initially seen on 10/29/2020.  She tested positive for COVID-19 on 09/05/2020.  Subsequently began having episodes of tachycardia near syncopal episodes.  Echocardiogram on 12/07/2020 showed normal biventricular function, no significant valvular disease.  Zio patch x12 days on 10/24/2020 showed no significant arrhythmias.  Patient triggered events corresponded to sinus rhythm.  BP Readings from Last 3 Encounters:  12/10/20 (!) 145/88  10/29/20 124/68  10/07/20 133/85      Past Medical History:  Diagnosis Date  . Atypical chest pain 09/10/2016  . Atypical nevus 09/12/2013   left forehead (severe), Right mid back (mild), right lower back (mild)  . Atypical nevus 09/09/2014   right outer abdomen (mild), right side neck (mild), left upper arm (moderate),   . Atypical nevus 09/24/2015   left anterior shin (moderate), left outer back (moderate)  . Atypical nevus 09/26/2017   right back outer (mild), left lower thigh (severe), left upper thigh (moderate), left chest (moderate)  . Atypical nevus 09/17/2018   right mid paraspinal (moderate), right inner shin (moderate)  . GERD (gastroesophageal reflux disease)   . Hypertension   . Insulin resistance   . Shortness of breath 09/10/2016    Past Surgical History:  Procedure Laterality Date  . FINGER SURGERY    . MYRINGOTOMY    . TONSILLECTOMY      Current Medications: No outpatient medications have been marked as taking for the 12/10/20 encounter (Appointment) with Donato Heinz, MD.      Allergies:   Latex   Social History   Socioeconomic History  . Marital status: Single    Spouse name: Not on file  . Number of children: Not on file  . Years of education: Not on file  . Highest education level: Not on file  Occupational History  . Not on file  Tobacco Use  . Smoking status: Never Smoker  . Smokeless tobacco: Never Used  Substance and Sexual Activity  . Alcohol use: Yes    Comment: socially  . Drug use: No  . Sexual activity: Not on file  Other Topics Concern  . Not on file  Social History Narrative  . Not on file   Social Determinants of Health   Financial Resource Strain: Not on file  Food Insecurity: Not on file  Transportation Needs: Not on file  Physical Activity: Not on file  Stress: Not on file  Social Connections: Not on file     Family History: The patient's family history includes Asthma in her mother; Eczema in her brother; Heart attack in her cousin; Hypertension in her father, maternal grandfather, and paternal grandmother.  ROS:   Please see the history of present illness.    All other systems reviewed and are negative.  EKGs/Labs/Other Studies Reviewed:    The following studies were reviewed today:   EKG:   4/22-normal sinus rhythm, rate 92, no ST abnormalities  Recent Labs: 10/06/2020: ALT 22; BUN 11; Creatinine, Ser 0.69; Hemoglobin 12.1; Platelets 250; Potassium 4.3;  Sodium 141  Recent Lipid Panel No results found for: CHOL, TRIG, HDL, CHOLHDL, VLDL, LDLCALC, LDLDIRECT  Physical Exam:    VS:  There were no vitals taken for this visit.    Wt Readings from Last 3 Encounters:  10/29/20 270 lb (122.5 kg)  09/21/20 268 lb (121.6 kg)  09/07/16 238 lb 3.2 oz (108 kg)     OIN:OMVE nourished, well developed in no acute distress HEENT: Normal NECK: No JVD; No carotid bruits LYMPHATICS: No lymphadenopathy CARDIAC: RRR, no murmurs, rubs, gallops RESPIRATORY:  Clear to auscultation without rales, wheezing or rhonchi   ABDOMEN: Soft, non-tender, non-distended MUSCULOSKELETAL:  No edema; No deformity  SKIN: Warm and dry NEUROLOGIC:  Alert and oriented x 3 PSYCHIATRIC:  Normal affect   ASSESSMENT:    No diagnosis found. PLAN:    Near syncope: Reports episodes of tachycardia and near syncope since COVID-19 infection.  Echocardiogram on 12/07/2020 showed normal biventricular function, no significant valvular disease.  PCP already ordered Zio patch x2 weeks, will follow up results to evaluate for arrhythmia.  Hypertension: On losartan-HCTZ 100-25 mg daily.  Appears controlled  Resistant hypertension: Given resistant hypertension, warrants work-up for secondary causes - Renal duplex  - Sleep study - CMET, TSH, renin/aldosterone - Serum metanephrines - TTE  Secondary Causes of Hypertension   Medications/Herbal: OCP, steroids, stimulants, antidepressants, weight loss medication, immune suppressants, NSAIDs, sympathomimetics, alcohol, caffeine, licorice, ginseng, St. John's wort, chemo (none identified)   Sleep Apnea: (Uses BiPAP regularly) Renal artery stenosis: Negative for RAS 06/12/20 Hyperaldosteronism: renin/aldo pending Hyper/hypothyroidism: TSH normal 01/2020 Pheochromocytoma: (testing not indicated) Cushing's syndrome: cortisol normal, though was checked on steroids in the hospital. Coarctation of the aorta: BP symmetric   RTC in ***  Medication Adjustments/Labs and Tests Ordered: Current medicines are reviewed at length with the patient today.  Concerns regarding medicines are outlined above.  No orders of the defined types were placed in this encounter.  No orders of the defined types were placed in this encounter.   There are no Patient Instructions on file for this visit.     Signed, Donato Heinz, MD  12/09/2020 10:53 PM    [facility]

## 2020-12-10 ENCOUNTER — Other Ambulatory Visit: Payer: Self-pay

## 2020-12-10 ENCOUNTER — Ambulatory Visit: Payer: BC Managed Care – PPO | Admitting: Cardiology

## 2020-12-10 ENCOUNTER — Encounter: Payer: Self-pay | Admitting: Cardiology

## 2020-12-10 VITALS — BP 145/88 | HR 95 | Ht 67.0 in | Wt 276.0 lb

## 2020-12-10 DIAGNOSIS — Z8639 Personal history of other endocrine, nutritional and metabolic disease: Secondary | ICD-10-CM | POA: Diagnosis not present

## 2020-12-10 DIAGNOSIS — R400 Somnolence: Secondary | ICD-10-CM

## 2020-12-10 DIAGNOSIS — I10 Essential (primary) hypertension: Secondary | ICD-10-CM | POA: Diagnosis not present

## 2020-12-10 DIAGNOSIS — R55 Syncope and collapse: Secondary | ICD-10-CM

## 2020-12-10 NOTE — Patient Instructions (Signed)
Medication Instructions:  Your physician recommends that you continue on your current medications as directed. Please refer to the Current Medication list given to you today.  *If you need a refill on your cardiac medications before your next appointment, please call your pharmacy*   Lab Work: Renin/Aldosterone   If you have labs (blood work) drawn today and your tests are completely normal, you will receive your results only by: Marland Kitchen MyChart Message (if you have MyChart) OR . A paper copy in the mail If you have any lab test that is abnormal or we need to change your treatment, we will call you to review the results.   Testing/Procedures: Your physician has requested that you have a renal artery duplex. During this test, an ultrasound is used to evaluate blood flow to the kidneys. Allow one hour for this exam. Do not eat after midnight the day before and avoid carbonated beverages. Take your medications as you usually do.  Your physician has recommended that you have a sleep study. This test records several body functions during sleep, including: brain activity, eye movement, oxygen and carbon dioxide blood levels, heart rate and rhythm, breathing rate and rhythm, the flow of air through your mouth and nose, snoring, body muscle movements, and chest and belly movement.  Follow-Up: At [facility], you and your health needs are our priority.  As part of our continuing mission to provide you with exceptional heart care, we have created designated Provider Care Teams.  These Care Teams include your primary Cardiologist (physician) and Advanced Practice Providers (APPs -  Physician Assistants and Nurse Practitioners) who all work together to provide you with the care you need, when you need it.  We recommend signing up for the patient portal called "MyChart".  Sign up information is provided on this After Visit Summary.  MyChart is used to connect with patients for Virtual Visits (Telemedicine).   Patients are able to view lab/test results, encounter notes, upcoming appointments, etc.  Non-urgent messages can be sent to your provider as well.   To learn more about what you can do with MyChart, go to [email or web address].    Your next appointment:   12 month(s)  The format for your next appointment:   In Person  Provider:   Oswaldo Milian, MD   Other Instructions You have been referred to Endocrinology

## 2020-12-10 NOTE — Progress Notes (Signed)
Cardiology Office Note:    Date:  12/10/2020   ID:  Summer Flores, DOB 1990/11/25, MRN [id number]  PCP:  Lavella Lemons, PA  Cardiologist:  None  Electrophysiologist:  None   Referring MD: Ralph Leyden, FNP   Chief Complaint  Patient presents with  . Palpitations    History of Present Illness:    Summer Flores is a 30 y.o. female with a hx of recent COVID-19 infection who presents for follow-up.  She was referred by Lazaro Arms, NP for evaluation of tachycardia, initially seen on 10/29/2020.  She tested positive for COVID-19 on 09/05/2020.  Subsequently began having episodes of tachycardia near syncopal episodes.  Echocardiogram on 12/07/2020 showed normal biventricular function, no significant valvular disease.  Zio patch x12 days on 10/24/2020 showed no significant arrhythmias.  Patient triggered events corresponded to sinus rhythm.  Today she is accompanied by her mother. Since last clinic visit, she reports her heart rate is still fluctuating. Lately, she is more conscious of her higher heart rate and is able to feel when it occurs. She notes her resting heart rate is averaging in the 90s recently, when it was in the 70s prior. At night, her heart rate is stable. While she was wearing the heart monitor she was working from home and more sedentary. At that time her elevated heart rate would occur at rest or exertion, including sitting while watching television. She notes that her at home blood pressure is also higher than her baseline (prior to Covid) on average. She does not believe she snores due to her adenoidectomy and tonsillectomy, but has not had a sleep study. During the day she is occasionally more fatigued than usual. Typically she will sleep at 2 in the morning. She notes occasional LE edema around her ankles, especially in the summer. She denies any chest pain, shortness of breath, or palpitations. No syncope or lightheadedness to note. Also has no orthopnea or PND. She is  participating in photobiomodulation (PBM) therapy, a technique using infrared-light to treat lesions and pain. Her father and paternal grandmother have hypertension.   Past Medical History:  Diagnosis Date  . Atypical chest pain 09/10/2016  . Atypical nevus 09/12/2013   left forehead (severe), Right mid back (mild), right lower back (mild)  . Atypical nevus 09/09/2014   right outer abdomen (mild), right side neck (mild), left upper arm (moderate),   . Atypical nevus 09/24/2015   left anterior shin (moderate), left outer back (moderate)  . Atypical nevus 09/26/2017   right back outer (mild), left lower thigh (severe), left upper thigh (moderate), left chest (moderate)  . Atypical nevus 09/17/2018   right mid paraspinal (moderate), right inner shin (moderate)  . GERD (gastroesophageal reflux disease)   . Hypertension   . Insulin resistance   . Shortness of breath 09/10/2016    Past Surgical History:  Procedure Laterality Date  . FINGER SURGERY    . MYRINGOTOMY    . TONSILLECTOMY      Current Medications: Current Meds  Medication Sig  . Ascorbic Acid (VITAMIN C PO) Take 1 tablet by mouth daily.  . ferrous sulfate 325 (65 FE) MG tablet Take 325 mg by mouth daily with breakfast.  . losartan-hydrochlorothiazide (HYZAAR) 100-25 MG tablet Take 1 tablet by mouth daily.  . norethindrone-ethinyl estradiol (CYCLAFEM) 0.5/0.75/1-35 MG-MCG tablet Nortrel 1/35 (28) 1 mg-35 mcg tablet  Take 1 tablet every day by oral route.  . norethindrone-ethinyl estradiol 1/35 (ORTHO-NOVUM, NORTREL,CYCLAFEM) tablet Take 1 tablet  by mouth daily.  . pantoprazole (PROTONIX) 40 MG tablet pantoprazole 40 mg tablet,delayed release  TAKE 1 TABLET BY MOUTH DAILY  . Probiotic Product (PROBIOTIC DAILY PO) Take 1 capsule by mouth daily.  . TURMERIC PO Take 1 tablet by mouth daily.  . vitamin B-12 (CYANOCOBALAMIN) 1000 MCG tablet Take 1,000 mcg by mouth daily.  . Vitamin D, Ergocalciferol, (DRISDOL) 1.25 MG (50000  UNIT) CAPS capsule Take 50,000 Units by mouth once a week.  . [DISCONTINUED] predniSONE (DELTASONE) 10 MG tablet Take 10 mg by mouth daily with breakfast.     Allergies:   Latex   Social History   Socioeconomic History  . Marital status: Single    Spouse name: Not on file  . Number of children: Not on file  . Years of education: Not on file  . Highest education level: Not on file  Occupational History  . Not on file  Tobacco Use  . Smoking status: Never Smoker  . Smokeless tobacco: Never Used  Substance and Sexual Activity  . Alcohol use: Yes    Comment: socially  . Drug use: No  . Sexual activity: Not on file  Other Topics Concern  . Not on file  Social History Narrative  . Not on file   Social Determinants of Health   Financial Resource Strain: Not on file  Food Insecurity: Not on file  Transportation Needs: Not on file  Physical Activity: Not on file  Stress: Not on file  Social Connections: Not on file     Family History: The patient's family history includes Asthma in her mother; Eczema in her brother; Heart attack in her cousin; Hypertension in her father, maternal grandfather, and paternal grandmother.  ROS:   Please see the history of present illness.    (+) Elevated heart rate (+) Fatigue (+) LE edema All other systems reviewed and are negative.  EKGs/Labs/Other Studies Reviewed:    The following studies were reviewed today:  Echo 12/07/2020: 1. Left ventricular ejection fraction, by estimation, is 65 to 70%. The  left ventricle has normal function. The left ventricle has no regional  wall motion abnormalities. Left ventricular diastolic parameters were  normal.  2. Right ventricular systolic function is normal. The right ventricular  size is normal. Tricuspid regurgitation signal is inadequate for assessing  PA pressure.  3. The mitral valve is normal in structure. No evidence of mitral valve  regurgitation. No evidence of mitral stenosis.   4. The aortic valve was not well visualized. Aortic valve regurgitation  is not visualized. No aortic stenosis is present.  5. The inferior vena cava is normal in size with greater than 50%  respiratory variability, suggesting right atrial pressure of 3 mmHg.   Echo 09/25/2016: - Left ventricle: The cavity size was normal. Wall thickness was  normal. Systolic function was normal. The estimated ejection  fraction was in the range of 55% to 60%. Wall motion was normal;  there were no regional wall motion abnormalities. Left  ventricular diastolic function parameters were normal.    EKG:   10/29/1020: normal sinus rhythm, rate 92, no ST abnormalities   Recent Labs: 10/06/2020: ALT 22; BUN 11; Creatinine, Ser 0.69; Hemoglobin 12.1; Platelets 250; Potassium 4.3; Sodium 141  Recent Lipid Panel No results found for: CHOL, TRIG, HDL, CHOLHDL, VLDL, LDLCALC, LDLDIRECT  Physical Exam:    VS:  BP (!) 145/88 (BP Location: Left Arm, Patient Position: Sitting, Cuff Size: Large)   Pulse 95   Ht 5\' 7"  (  1.702 m)   Wt 276 lb (125.2 kg)   BMI 43.23 kg/m     Wt Readings from Last 3 Encounters:  12/10/20 276 lb (125.2 kg)  10/29/20 270 lb (122.5 kg)  09/21/20 268 lb (121.6 kg)     [id number] nourished, well developed in no acute distress HEENT: Normal NECK: No JVD; No carotid bruits CARDIAC: RRR, no murmurs, rubs, gallops RESPIRATORY:  Clear to auscultation without rales, wheezing or rhonchi  ABDOMEN: Soft, non-tender, non-distended MUSCULOSKELETAL:  No edema; No deformity  SKIN: Warm and dry NEUROLOGIC:  Alert and oriented x 3 PSYCHIATRIC:  Normal affect   ASSESSMENT:    1. Hypertension, unspecified type   2. Daytime somnolence   3. History of Cushing's syndrome   4. Near syncope    PLAN:    Near syncope: Reports episodes of tachycardia and near syncope since COVID-19 infection.  Echocardiogram on 12/07/2020 showed normal biventricular function, no significant valvular  disease.  Zio patch x12 days on 10/24/2020 showed no significant arrhythmias.  Patient triggered events corresponded to sinus rhythm.  No further cardiac work-up recommended.  Hypertension: On losartan-HCTZ 100-25 mg daily, reports has been on these medications for years.  Unusual to have hypertension at such a young age, concerning for secondary causes.  Suspect untreated OSA may be contributing.  OCPs may also be contributing -She reports was diagnosed with hypercortisolism in high school but was not started on treatment and does not follow with endocrinology.  Will refer to endocrinology -Renal duplex -Sleep study -Renin/aldosterone   Daytime somnolence: Check sleep study  RTC in 1 year   Medication Adjustments/Labs and Tests Ordered: Current medicines are reviewed at length with the patient today.  Concerns regarding medicines are outlined above.  Orders Placed This Encounter  Procedures  . Aldosterone + renin activity w/ ratio  . Ambulatory referral to Endocrinology  . Split night study  . VAS US RENAL ARTERY DUPLEX   No orders of the defined types were placed in this encounter.   Patient Instructions  Medication Instructions:  Your physician recommends that you continue on your current medications as directed. Please refer to the Current Medication list given to you today.  *If you need a refill on your cardiac medications before your next appointment, please call your pharmacy*   Lab Work: Renin/Aldosterone   If you have labs (blood work) drawn today and your tests are completely normal, you will receive your results only by: Marland Kitchen MyChart Message (if you have MyChart) OR . A paper copy in the mail If you have any lab test that is abnormal or we need to change your treatment, we will call you to review the results.   Testing/Procedures: Your physician has requested that you have a renal artery duplex. During this test, an ultrasound is used to evaluate blood flow to the  kidneys. Allow one hour for this exam. Do not eat after midnight the day before and avoid carbonated beverages. Take your medications as you usually do.  Your physician has recommended that you have a sleep study. This test records several body functions during sleep, including: brain activity, eye movement, oxygen and carbon dioxide blood levels, heart rate and rhythm, breathing rate and rhythm, the flow of air through your mouth and nose, snoring, body muscle movements, and chest and belly movement.  Follow-Up: At [facility], you and your health needs are our priority.  As part of our continuing mission to provide you with exceptional heart care, we have created  designated Provider Care Teams.  These Care Teams include your primary Cardiologist (physician) and Advanced Practice Providers (APPs -  Physician Assistants and Nurse Practitioners) who all work together to provide you with the care you need, when you need it.  We recommend signing up for the patient portal called "MyChart".  Sign up information is provided on this After Visit Summary.  MyChart is used to connect with patients for Virtual Visits (Telemedicine).  Patients are able to view lab/test results, encounter notes, upcoming appointments, etc.  Non-urgent messages can be sent to your provider as well.   To learn more about what you can do with MyChart, go to [email or web address].    Your next appointment:   12 month(s)  The format for your next appointment:   In Person  Provider:   Oswaldo Milian, MD   Other Instructions You have been referred to Endocrinology      [facility],acting as a scribe for Donato Heinz, MD.,have documented all relevant documentation on the behalf of Donato Heinz, MD,as directed by  Donato Heinz, MD while in the presence of Donato Heinz, MD.  I, Donato Heinz, MD, have reviewed all documentation for this visit. The documentation on  12/10/20 for the exam, diagnosis, procedures, and orders are all accurate and complete.   Signed, Donato Heinz, MD  12/10/2020 5:51 PM    [facility]

## 2020-12-14 ENCOUNTER — Telehealth: Payer: Self-pay | Admitting: *Deleted

## 2020-12-14 ENCOUNTER — Other Ambulatory Visit: Payer: Self-pay | Admitting: Cardiology

## 2020-12-14 DIAGNOSIS — R400 Somnolence: Secondary | ICD-10-CM

## 2020-12-14 DIAGNOSIS — R0683 Snoring: Secondary | ICD-10-CM

## 2020-12-14 NOTE — Telephone Encounter (Signed)
Patient notified [employer] denied in lab sleep study. No co morbidities. Approved HST [facility]. Patient aware of appointment details.

## 2020-12-14 NOTE — Telephone Encounter (Signed)
-----   Message from Silverio Lay, RN sent at 12/10/2020  4:42 PM EDT ----- Regarding: sleep study Sleep study ordered  Thanks!

## 2020-12-21 ENCOUNTER — Ambulatory Visit: Payer: BC Managed Care – PPO | Attending: Cardiology | Admitting: Cardiovascular Disease

## 2020-12-21 ENCOUNTER — Other Ambulatory Visit: Payer: Self-pay

## 2020-12-21 DIAGNOSIS — R400 Somnolence: Secondary | ICD-10-CM

## 2020-12-21 DIAGNOSIS — R0683 Snoring: Secondary | ICD-10-CM | POA: Diagnosis not present

## 2020-12-24 ENCOUNTER — Other Ambulatory Visit: Payer: Self-pay

## 2020-12-24 ENCOUNTER — Ambulatory Visit (HOSPITAL_COMMUNITY)
Admission: RE | Admit: 2020-12-24 | Discharge: 2020-12-24 | Disposition: A | Discharge status: Home / Self Care | Payer: BC Managed Care – PPO | Source: Ambulatory Visit | Attending: Cardiovascular Disease | Admitting: Cardiovascular Disease

## 2020-12-24 DIAGNOSIS — I10 Essential (primary) hypertension: Secondary | ICD-10-CM | POA: Diagnosis present

## 2020-12-25 LAB — ALDOSTERONE + RENIN ACTIVITY W/ RATIO
ALDOS/RENIN RATIO: 0.4 (ref 0.0–30.0)
ALDOSTERONE: 7.2 ng/dL (ref 0.0–30.0)
Renin: 19.423 ng/mL/hr — ABNORMAL HIGH (ref 0.167–5.380)

## 2020-12-31 ENCOUNTER — Other Ambulatory Visit: Payer: Self-pay

## 2020-12-31 NOTE — Telephone Encounter (Signed)
See result note, would defer to endocrinology

## 2021-01-15 ENCOUNTER — Encounter: Payer: Self-pay | Admitting: Cardiovascular Disease

## 2021-01-15 NOTE — Procedures (Signed)
                                [place]         Patient Name: Summer Flores, Summer Flores Date: 12/21/2020 Gender: Female D.O.B: 1990/12/07 Age (years): 30 Referring Provider: Oswaldo Milian Height (inches): 33 Interpreting Physician: Shelva Majestic MD, ABSM Weight (lbs): 276 RPSGT: Peak, Robert BMI: 43 MRN: [id number] Neck Size: <br>  CLINICAL INFORMATION Sleep Study Type: HST  Indication for sleep study: Snoring  Epworth Sleepiness Score: 4  SLEEP STUDY TECHNIQUE A multi-channel overnight portable sleep study was performed. The channels recorded were: nasal airflow, thoracic respiratory movement, and oxygen saturation with a pulse oximetry. Snoring was also monitored.  MEDICATIONS Patient self administered medications include: N/A.  SLEEP ARCHITECTURE Patient was studied for 457.4 minutes. The sleep efficiency was 95.3 % and the patient was supine for 47.1%. The arousal index was 0.0 per hour.  RESPIRATORY PARAMETERS The overall AHI was 1.8 per hour, with a central apnea index of 0 per hour. Supine sleep AHI 3.9/h. Unable to evaluate REM sleep on this home study.   The oxygen nadir was 90% during sleep.  CARDIAC DATA Mean heart rate during sleep was 83.9 bpm.  IMPRESSIONS - No significant obstructive sleep apnea occurred during this study (AHI 1.8/h). - The patient had no oxygen desaturation during the study (Min O2 90%) - Patient snored 0.2% during the sleep.  DIAGNOSIS - Normal study  RECOMMENDATIONS - There is no indication for CPAP. - Effort should be made to optimize nasal and oropharyngeal patency. - Avoid alcohol, sedatives and other CNS depressants that may worsen sleep apnea and disrupt normal sleep architecture. - Sleep hygiene should be reviewed to assess factors that may improve sleep quality. - Weight management (43) and regular exercise should be initiated or continued.   [Electronically signed] 01/15/2021 04:45 PM  Shelva Majestic MD, [address] [facility],  [place], American Board of Sleep Medicine   NPI: [id number] Agency PH: [phone]   FX: [phone] [place]

## 2021-01-20 ENCOUNTER — Telehealth: Payer: Self-pay | Admitting: *Deleted

## 2021-01-20 NOTE — Telephone Encounter (Signed)
HST results were sent to patient via MyChart.

## 2021-02-25 ENCOUNTER — Other Ambulatory Visit: Payer: Self-pay

## 2021-02-25 ENCOUNTER — Encounter: Payer: Self-pay | Admitting: Endocrinology

## 2021-02-25 ENCOUNTER — Ambulatory Visit (INDEPENDENT_AMBULATORY_CARE_PROVIDER_SITE_OTHER): Payer: BC Managed Care – PPO | Admitting: Endocrinology

## 2021-02-25 DIAGNOSIS — R635 Abnormal weight gain: Secondary | ICD-10-CM

## 2021-02-25 MED ORDER — DEXAMETHASONE 1 MG PO TABS: 1.0000 mg | ORAL_TABLET | ORAL | 0 refills | Status: DC

## 2021-02-25 NOTE — Patient Instructions (Signed)
Let's first check a 24-HR urine collection.   After this is done, please should do a "dexamethasone suppression test."  For this, you would take dexamethasone 1 mg at 10 pm (I have sent a prescription to your pharmacy), then come in for a "cortisol" blood test the next morning before 9 am.  You do not need to be fasting for this test.  I would be happy to see you back here as needed.

## 2021-02-25 NOTE — Progress Notes (Signed)
Subjective:    Patient ID: Summer Flores, female    DOB: 07-Apr-1991, 30 y.o.   MRN: [id number]  HPI Pt is referred by Dr Gardiner Rhyme.  Some is from pt's mother, due to hx extending back into pt's childhood.  She reports was diagnosed with hypercortisolism in high school but there was no known dx or rx.  She last took steroids 2 mos ago.  She has no h/o cancer, pituitary disorder, skin ulcers, cataracts, PUD, amenorrhea, osteoporosis, DM, or adrenal disorder.   She has heavy menses, headache, easy bruising, and chronic weight gain.   Past Medical History:  Diagnosis Date   Atypical chest pain 09/10/2016   Atypical nevus 09/12/2013   left forehead (severe), Right mid back (mild), right lower back (mild)   Atypical nevus 09/09/2014   right outer abdomen (mild), right side neck (mild), left upper arm (moderate),    Atypical nevus 09/24/2015   left anterior shin (moderate), left outer back (moderate)   Atypical nevus 09/26/2017   right back outer (mild), left lower thigh (severe), left upper thigh (moderate), left chest (moderate)   Atypical nevus 09/17/2018   right mid paraspinal (moderate), right inner shin (moderate)   GERD (gastroesophageal reflux disease)    Hypertension    Insulin resistance    Shortness of breath 09/10/2016    Past Surgical History:  Procedure Laterality Date   FINGER SURGERY     MYRINGOTOMY     TONSILLECTOMY      Social History   Socioeconomic History   Marital status: Single    Spouse name: Not on file   Number of children: Not on file   Years of education: Not on file   Highest education level: Not on file  Occupational History   Not on file  Tobacco Use   Smoking status: Never   Smokeless tobacco: Never  Substance and Sexual Activity   Alcohol use: Yes    Comment: socially   Drug use: No   Sexual activity: Not on file  Other Topics Concern   Not on file  Social History Narrative   Not on file   Social Determinants of Health   Financial  Resource Strain: Not on file  Food Insecurity: Not on file  Transportation Needs: Not on file  Physical Activity: Not on file  Stress: Not on file  Social Connections: Not on file  Intimate Partner Violence: Not on file    Current Outpatient Medications on File Prior to Visit  Medication Sig Dispense Refill   Ascorbic Acid (VITAMIN C PO) Take 1 tablet by mouth daily.     ferrous sulfate 325 (65 FE) MG tablet Take 325 mg by mouth daily with breakfast.     losartan-hydrochlorothiazide (HYZAAR) 100-25 MG tablet Take 1 tablet by mouth daily.     norethindrone-ethinyl estradiol 1/35 (ORTHO-NOVUM, NORTREL,CYCLAFEM) tablet Take 1 tablet by mouth daily.     pantoprazole (PROTONIX) 40 MG tablet pantoprazole 40 mg tablet,delayed release  TAKE 1 TABLET BY MOUTH DAILY     Probiotic Product (PROBIOTIC DAILY PO) Take 1 capsule by mouth daily.     TURMERIC PO Take 1 tablet by mouth daily.     vitamin B-12 (CYANOCOBALAMIN) 1000 MCG tablet Take 1,000 mcg by mouth daily.     Vitamin D, Ergocalciferol, (DRISDOL) 1.25 MG (50000 UNIT) CAPS capsule Take 50,000 Units by mouth once a week.     No current facility-administered medications on file prior to visit.    Allergies  Allergen  Reactions   Latex     Family History  Problem Relation Age of Onset   Asthma Mother    Hypertension Father    Eczema Brother    Hypertension Maternal Grandfather    Hypertension Paternal Grandmother    Heart attack Cousin    Adrenal disorder Neg Hx     BP 116/60 (BP Location: Right Arm, Patient Position: Sitting, Cuff Size: Large)   Pulse (!) 118   Ht '5\' 7"'$  (1.702 m)   Wt 277 lb 12.8 oz (126 kg)   SpO2 98%   BMI 43.51 kg/m     Review of Systems Denies hirsutism, depression, and rash on the abdomen.      Objective:   Physical Exam VS: see vs page GEN: no distress HEAD: head: no deformity eyes: no periorbital swelling, no proptosis.   external nose and ears are normal NECK: supple, thyroid is not  enlarged CHEST WALL: no deformity LUNGS: clear to auscultation CV: reg rate and rhythm, no murmur.  MUSCULOSKELETAL: gait is normal and steady EXTEMITIES: no deformity.  Trace bilat leg edema NEURO:  readily moves all 4's.  sensation is intact to touch on all 4's SKIN:  Normal texture and temperature.  No rash or suspicious lesion is visible.  Striae are not pigmented.   NODES:  None palpable at the neck PSYCH: alert, well-oriented.  Does not appear anxious nor depressed.      CT abd (2022) normal adrenals CT brain (non-contrast, 2022) no mention is made of the adrenals    Assessment & Plan:  Hypercortisolism, new to me, uncertain etiology and prognosis Weight gain: check TFT  Patient Instructions  Let's first check a 24-HR urine collection.   After this is done, please should do a "dexamethasone suppression test."  For this, you would take dexamethasone 1 mg at 10 pm (I have sent a prescription to your pharmacy), then come in for a "cortisol" blood test the next morning before 9 am.  You do not need to be fasting for this test.  I would be happy to see you back here as needed.

## 2021-02-28 ENCOUNTER — Other Ambulatory Visit: Payer: Self-pay

## 2021-02-28 ENCOUNTER — Other Ambulatory Visit: Payer: BC Managed Care – PPO

## 2021-02-28 DIAGNOSIS — R635 Abnormal weight gain: Secondary | ICD-10-CM

## 2021-03-01 ENCOUNTER — Other Ambulatory Visit: Payer: Self-pay

## 2021-03-01 ENCOUNTER — Other Ambulatory Visit (INDEPENDENT_AMBULATORY_CARE_PROVIDER_SITE_OTHER): Payer: BC Managed Care – PPO

## 2021-03-01 DIAGNOSIS — R635 Abnormal weight gain: Secondary | ICD-10-CM | POA: Diagnosis not present

## 2021-03-01 LAB — CORTISOL: Cortisol, Plasma: 0.8 ug/dL

## 2021-03-01 LAB — TSH: TSH: 1.58 u[IU]/mL (ref 0.35–5.50)

## 2021-03-01 LAB — T4, FREE: Free T4: 0.73 ng/dL (ref 0.60–1.60)

## 2021-03-07 LAB — CORTISOL, URINE, 24 HOUR
24 Hour urine volume (VMAHVA): 1450 mL
CREATININE, URINE: 0.66 g/(24.h) (ref 0.50–2.15)
Cortisol (Ur), Free: 14.4 mcg/24 h (ref 4.0–50.0)

## 2021-03-22 ENCOUNTER — Ambulatory Visit (INDEPENDENT_AMBULATORY_CARE_PROVIDER_SITE_OTHER): Payer: BC Managed Care – PPO | Admitting: Neurology

## 2021-03-22 ENCOUNTER — Encounter: Payer: Self-pay | Admitting: Neurology

## 2021-03-22 ENCOUNTER — Other Ambulatory Visit: Payer: Self-pay

## 2021-03-22 VITALS — BP 151/87 | HR 97 | Ht 66.0 in | Wt 277.5 lb

## 2021-03-22 DIAGNOSIS — R519 Headache, unspecified: Secondary | ICD-10-CM | POA: Diagnosis not present

## 2021-03-22 DIAGNOSIS — G8929 Other chronic pain: Secondary | ICD-10-CM | POA: Diagnosis not present

## 2021-03-22 DIAGNOSIS — U099 Post covid-19 condition, unspecified: Secondary | ICD-10-CM | POA: Diagnosis not present

## 2021-03-22 DIAGNOSIS — H9319 Tinnitus, unspecified ear: Secondary | ICD-10-CM

## 2021-03-22 MED ORDER — SUMATRIPTAN SUCCINATE 50 MG PO TABS: 50.0000 mg | ORAL_TABLET | ORAL | 6 refills | Status: DC | PRN

## 2021-03-22 MED ORDER — NORTRIPTYLINE HCL 10 MG PO CAPS: 30.0000 mg | ORAL_CAPSULE | Freq: Every day | ORAL | 11 refills | Status: DC

## 2021-03-22 NOTE — Progress Notes (Signed)
Chief Complaint  Patient presents with   New Patient (Initial Visit)    New patient:  long covid issues since being positive Covid 08/2020 Room 16, mom Summer Flores in room      [name] is a 30 y.o. female Post-COVID, test positive in early February 2022 Frequent headache with migraine features Tinnitus, Frequent dizziness  MRI of the brain with without contrast to rule out structural abnormality  Laboratory evaluations ESR C-reactive protein ANA   Imitrex 50 mg as needed,  Add on nortriptyline, 10 mg titrating to 30 mg every night as preventive medications   DIAGNOSTIC DATA (LABS, IMAGING, TESTING) - I reviewed patient records, labs, notes, testing and imaging myself where available.   MEDICAL HISTORY:  Summer Flores is a 30 year old female, seen in request by her primary care PA Aggie Hacker for evaluation of frequent headaches, and other constellation of symptoms, initial evaluation was on March 22, 2021,   I reviewed and summarized the referring note. PMHX. GERD HTN Obesity  She suffered COVID in early February 2022, presented with headache, a week later, she began to have frequent diarrhea, at the same time, she began to experience dizziness, intermittent fingertips, facial paresthesia, heart racing fast, ear pain, noise sensitivity, fainting spells,  She presented to emergency room on on September 21, 2020, laboratory evaluation showed CBC elevated WBC of 11.2, hemoglobin of 12.4, BMP, glucose of 113, creatinine of 0.66, CT head wo was Sep 21 2020 normal.   More extensive laboratory evaluation in May 2022, normal CBC hemoglobin of 12, ferritin 27, folic acid 8.8, factor III 148, elevated  Renin19.423, free cortisone 14.4, normal TSH, free T4  Over the past few months, she was seen by different specialist, GI, hematology cardiologist, evaluation failed to demonstrate etiology, was recently seen by ENT, was given 16 days tapering dose of  prednisone, which has helped her ear pain, hypersensitivity.  Her symptoms overall has improved, but she continues to have frequent headaches, couple times a day.  Bifrontal pounding headache with light sensitivity, ibuprofen has been helpful    PHYSICAL EXAM:   Vitals:   03/22/21 1542  BP: (!) 151/87  Pulse: 97  Weight: 277 lb 8 oz (125.9 kg)  Height: _0  (1.676 m)   Not recorded     Body mass index is 44.79 kg/m.  PHYSICAL EXAMNIATION:  Gen: NAD, conversant, well nourised, well groomed                     Cardiovascular: Regular rate rhythm, no peripheral edema, warm, nontender. Eyes: Conjunctivae clear without exudates or hemorrhage Neck: Supple, no carotid bruits. Pulmonary: Clear to auscultation bilaterally   NEUROLOGICAL EXAM:  MENTAL STATUS: Speech:    Speech is normal; fluent and spontaneous with normal comprehension.  Cognition:     Orientation to time, place and person     Normal recent and remote memory     Normal Attention span and concentration     Normal Language, naming, repeating,spontaneous speech     Fund of knowledge   CRANIAL NERVES: CN II: Visual fields are full to confrontation. Pupils are round equal and briskly reactive to light. CN III, IV, VI: extraocular movement are normal. No ptosis. CN V: Facial sensation is intact to light touch CN VII: Face is symmetric with normal eye closure  CN VIII: Hearing is normal to causal conversation. CN IX, X: Phonation is normal. CN XI: Head turning and shoulder shrug  are intact  MOTOR: There is no pronator drift of out-stretched arms. Muscle bulk and tone are normal. Muscle strength is normal.  REFLEXES: Reflexes are 2+ and symmetric at the biceps, triceps, knees, and ankles. Plantar responses are flexor.  SENSORY: Intact to light touch, pinprick and vibratory sensation are intact in fingers and toes.  COORDINATION: There is no trunk or limb dysmetria noted.  GAIT/STANCE: Posture is normal.  Gait is steady with normal steps, base, arm swing, and turning. Heel and toe walking are normal. Tandem gait is normal.  Romberg is absent.  REVIEW OF SYSTEMS:  Full 14 system review of systems performed and notable only for as above All other review of systems were negative.   ALLERGIES: Allergies  Allergen Reactions   Latex     HOME MEDICATIONS: Current Outpatient Medications  Medication Sig Dispense Refill   Ascorbic Acid (VITAMIN C PO) Take 1 tablet by mouth daily.     losartan-hydrochlorothiazide (HYZAAR) 100-25 MG tablet Take 1 tablet by mouth daily.     norethindrone-ethinyl estradiol 1/35 (ORTHO-NOVUM, NORTREL,CYCLAFEM) tablet Take 1 tablet by mouth daily.     pantoprazole (PROTONIX) 40 MG tablet pantoprazole 40 mg tablet,delayed release  TAKE 1 TABLET BY MOUTH DAILY     Probiotic Product (PROBIOTIC DAILY PO) Take 1 capsule by mouth daily.     TURMERIC PO Take 1 tablet by mouth daily.     vitamin B-12 (CYANOCOBALAMIN) 1000 MCG tablet Take 1,000 mcg by mouth daily.     Vitamin D, Ergocalciferol, (DRISDOL) 1.25 MG (50000 UNIT) CAPS capsule Take 50,000 Units by mouth once a week.     No current facility-administered medications for this visit.    PAST MEDICAL HISTORY: Past Medical History:  Diagnosis Date   Atypical chest pain 09/10/2016   Atypical nevus 09/12/2013   left forehead (severe), Right mid back (mild), right lower back (mild)   Atypical nevus 09/09/2014   right outer abdomen (mild), right side neck (mild), left upper arm (moderate),    Atypical nevus 09/24/2015   left anterior shin (moderate), left outer back (moderate)   Atypical nevus 09/26/2017   right back outer (mild), left lower thigh (severe), left upper thigh (moderate), left chest (moderate)   Atypical nevus 09/17/2018   right mid paraspinal (moderate), right inner shin (moderate)   GERD (gastroesophageal reflux disease)    Hypertension    Insulin resistance    Shortness of breath 09/10/2016     PAST SURGICAL HISTORY: Past Surgical History:  Procedure Laterality Date   FINGER SURGERY     MYRINGOTOMY     TONSILLECTOMY      FAMILY HISTORY: Family History  Problem Relation Age of Onset   Asthma Mother    Hypertension Father    Eczema Brother    Hypertension Maternal Grandfather    Hypertension Paternal Grandmother    Heart attack Cousin    Adrenal disorder Neg Hx     SOCIAL HISTORY: Social History   Socioeconomic History   Marital status: Single    Spouse name: Not on file   Number of children: Not on file   Years of education: Not on file   Highest education level: Not on file  Occupational History   Not on file  Tobacco Use   Smoking status: Never   Smokeless tobacco: Never  Substance and Sexual Activity   Alcohol use: Yes    Comment: socially   Drug use: No   Sexual activity: Not on file  Other Topics Concern  Not on file  Social History Narrative   Lives alone   Left Handed   Drinks 8-9 cups caffeine daily   Social Determinants of Health   Financial Resource Strain: Not on file  Food Insecurity: Not on file  Transportation Needs: Not on file  Physical Activity: Not on file  Stress: Not on file  Social Connections: Not on file  Intimate Partner Violence: Not on file      Marcial Pacas, M.D. Ph.D.  [facility] [address], [place], [place] [address] Ph: [phone] Fax: [phone]  CC:  Lavella Lemons, [place],  [place] [address]  Lavella Lemons, [place]

## 2021-03-23 ENCOUNTER — Telehealth: Payer: Self-pay | Admitting: Neurology

## 2021-03-23 LAB — SEDIMENTATION RATE: Sed Rate: 16 mm/hr (ref 0–32)

## 2021-03-23 LAB — C-REACTIVE PROTEIN: CRP: 25 mg/L — ABNORMAL HIGH (ref 0–10)

## 2021-03-23 LAB — ANA W/REFLEX IF POSITIVE: Anti Nuclear Antibody (ANA): NEGATIVE

## 2021-03-23 NOTE — Telephone Encounter (Signed)
LVM for pt to call back about scheduling mri  03/23/21 [employer] auth: [id number] (exp. 03/23/21 to 04/21/21)

## 2021-03-29 NOTE — Telephone Encounter (Signed)
Patient returned my call she is scheduled at [facility] for 05/03/21.

## 2021-04-06 NOTE — Telephone Encounter (Addendum)
Would recommend endocrine evaluation as planned.  If endocrine work-up of her hypertension is unremarkable, then could plan CTA abdomen to definitively evaluate degree of renal artery stenosis, as this is a better test than the ultrasound (ultrasound suggests mild stenosis but we could evaluate this better on CTA)

## 2021-04-18 ENCOUNTER — Encounter: Payer: Self-pay | Admitting: Endocrinology

## 2021-04-27 NOTE — Telephone Encounter (Signed)
Updated Dillon Bjork: [id number] (exp. 04/27/21 to 05/26/21)

## 2021-05-02 NOTE — Telephone Encounter (Signed)
Patient r/s for 05/24/21.

## 2021-05-03 ENCOUNTER — Other Ambulatory Visit: Payer: BC Managed Care – PPO

## 2021-05-24 ENCOUNTER — Other Ambulatory Visit: Payer: Self-pay

## 2021-05-24 ENCOUNTER — Ambulatory Visit (INDEPENDENT_AMBULATORY_CARE_PROVIDER_SITE_OTHER): Payer: BC Managed Care – PPO

## 2021-05-24 DIAGNOSIS — R519 Headache, unspecified: Secondary | ICD-10-CM

## 2021-05-24 DIAGNOSIS — H9319 Tinnitus, unspecified ear: Secondary | ICD-10-CM | POA: Diagnosis not present

## 2021-05-24 DIAGNOSIS — G8929 Other chronic pain: Secondary | ICD-10-CM

## 2021-05-24 MED ORDER — GADOBENATE DIMEGLUMINE 529 MG/ML IV SOLN
20.0000 mL | Freq: Once | INTRAVENOUS | Status: AC | PRN
  Administered 2021-05-24: 20 mL via INTRAVENOUS

## 2021-07-14 ENCOUNTER — Encounter: Payer: Self-pay | Admitting: Cardiology

## 2021-07-17 NOTE — Telephone Encounter (Signed)
Could we schedule her on 12/23 or 1/13 (whichever works best for her, can just add on extra appointment at end of quarter day) to discuss this further?

## 2021-07-19 ENCOUNTER — Telehealth: Payer: Self-pay

## 2021-07-19 NOTE — Telephone Encounter (Signed)
Called pt to set up appointment to discuss results. No answer at this time. Left a message for her to return the call.

## 2021-10-07 ENCOUNTER — Encounter: Payer: Self-pay | Admitting: Cardiology

## 2021-10-18 NOTE — Progress Notes (Signed)
?Cardiology Office Note:   ? ?Date:  10/19/2021  ? ?ID:  Summer Flores, DOB Dec 15, 1990, MRN [id number] ? ?PCP:  Lavella Lemons, PA  ?Cardiologist:  None  ?Electrophysiologist:  None  ? ?Referring MD: Lavella Lemons, PA  ? ?No chief complaint on file. ? ? ?History of Present Illness:   ? ?Summer Flores is a 31 y.o. female with a hx of recent COVID-19 infection who presents for follow-up.  She was referred by Lazaro Arms, NP for evaluation of tachycardia, initially seen on 10/29/2020.  She tested positive for COVID-19 on 09/05/2020.  Subsequently began having episodes of tachycardia near syncopal episodes. ? ?Echocardiogram on 12/07/2020 showed normal biventricular function, no significant valvular disease.  Zio patch x12 days on 10/24/2020 showed no significant arrhythmias.  Patient triggered events corresponded to sinus rhythm.  Renal artery duplex on 12/24/2020 showed 1 to 59% stenosis of right renal artery, no left renal artery stenosis. ? ?Since last clinic visit, she reports that she has been doing okay.  Has been having palpitations where feels like heart is racing, but improved from prior visit.  Reports BP has been controlled.  Has not been exercising.  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  Reports has been under less stress. ? ? ?BP Readings from Last 3 Encounters:  ?10/19/21 138/84  ?03/22/21 (!) 151/87  ?02/25/21 116/60  ? ? ?Past Medical History:  ?Diagnosis Date  ? Atypical chest pain 09/10/2016  ? Atypical nevus 09/12/2013  ? left forehead (severe), Right mid back (mild), right lower back (mild)  ? Atypical nevus 09/09/2014  ? right outer abdomen (mild), right side neck (mild), left upper arm (moderate),   ? Atypical nevus 09/24/2015  ? left anterior shin (moderate), left outer back (moderate)  ? Atypical nevus 09/26/2017  ? right back outer (mild), left lower thigh (severe), left upper thigh (moderate), left chest (moderate)  ? Atypical nevus  09/17/2018  ? right mid paraspinal (moderate), right inner shin (moderate)  ? GERD (gastroesophageal reflux disease)   ? Hypertension   ? Insulin resistance   ? Shortness of breath 09/10/2016  ? ? ?Past Surgical History:  ?Procedure Laterality Date  ? FINGER SURGERY    ? MYRINGOTOMY    ? TONSILLECTOMY    ? ? ?Current Medications: ?Current Meds  ?Medication Sig  ? ALPRAZolam (XANAX) 0.5 MG tablet Take by mouth as needed.  ? Ascorbic Acid (VITAMIN C) 500 MG CAPS   ? Ferrous Sulfate (IRON) 325 (65 Fe) MG TABS 1 tablet  ? loratadine (CLARITIN) 10 MG tablet   ? losartan-hydrochlorothiazide (HYZAAR) 100-25 MG tablet Take 1 tablet by mouth daily.  ? norethindrone-ethinyl estradiol 1/35 (NORTREL 1/35, 28,) tablet See admin instructions.  ? pantoprazole (PROTONIX) 40 MG tablet pantoprazole 40 mg tablet,delayed release ? TAKE 1 TABLET BY MOUTH DAILY  ? Probiotic Product (PROBIOTIC DAILY PO) Take 1 capsule by mouth daily.  ? sertraline (ZOLOFT) 25 MG tablet Take 25 mg by mouth daily.  ? TURMERIC PO Take 1 tablet by mouth daily.  ? vitamin B-12 (CYANOCOBALAMIN) 1000 MCG tablet Take 1,000 mcg by mouth daily.  ? Vitamin D, Ergocalciferol, (DRISDOL) 1.25 MG (50000 UNIT) CAPS capsule Take 50,000 Units by mouth once a week.  ?  ? ?Allergies:   Latex  ? ?Social History  ? ?Socioeconomic History  ? Marital status: Single  ?  Spouse name: Not on file  ? Number of children: Not on  file  ? Years of education: Not on file  ? Highest education level: Not on file  ?Occupational History  ? Not on file  ?Tobacco Use  ? Smoking status: Never  ? Smokeless tobacco: Never  ?Substance and Sexual Activity  ? Alcohol use: Yes  ?  Comment: socially  ? Drug use: No  ? Sexual activity: Not on file  ?Other Topics Concern  ? Not on file  ?Social History Narrative  ? Lives alone  ? Left Handed  ? Drinks 8-9 cups caffeine daily  ? ?Social Determinants of Health  ? ?Financial Resource Strain: Not on file  ?Food Insecurity: Not on file  ?Transportation Needs:  Not on file  ?Physical Activity: Not on file  ?Stress: Not on file  ?Social Connections: Not on file  ?  ? ?Family History: ?The patient's family history includes Asthma in her mother; Eczema in her brother; Heart attack in her cousin; Hypertension in her father, maternal grandfather, and paternal grandmother. There is no history of Adrenal disorder. ? ?ROS:   ?Please see the history of present illness.    ?All other systems reviewed and are negative. ? ?EKGs/Labs/Other Studies Reviewed:   ? ?The following studies were reviewed today: ? ?Echo 12/07/2020: ?1. Left ventricular ejection fraction, by estimation, is 65 to 70%. The  ?left ventricle has normal function. The left ventricle has no regional  ?wall motion abnormalities. Left ventricular diastolic parameters were  ?normal.  ? 2. Right ventricular systolic function is normal. The right ventricular  ?size is normal. Tricuspid regurgitation signal is inadequate for assessing  ?PA pressure.  ? 3. The mitral valve is normal in structure. No evidence of mitral valve  ?regurgitation. No evidence of mitral stenosis.  ? 4. The aortic valve was not well visualized. Aortic valve regurgitation  ?is not visualized. No aortic stenosis is present.  ? 5. The inferior vena cava is normal in size with greater than 50%  ?respiratory variability, suggesting right atrial pressure of 3 mmHg.  ? ?Echo 09/25/2016: ?- Left ventricle: The cavity size was normal. Wall thickness was  ?  normal. Systolic function was normal. The estimated ejection  ?  fraction was in the range of 55% to 60%. Wall motion was normal;  ?  there were no regional wall motion abnormalities. Left  ?  ventricular diastolic function parameters were normal.  ? ? ?EKG:   ?10/29/1020: normal sinus rhythm, rate 92, no ST abnormalities ?  ?Recent Labs: ?03/01/2021: TSH 1.58  ?Recent Lipid Panel ?No results found for: CHOL, TRIG, HDL, CHOLHDL, VLDL, LDLCALC, LDLDIRECT ? ?Physical Exam:   ? ?VS:  BP 138/84   Pulse (!) 104    Ht '5\' 6"'$  (1.676 m)   Wt 277 lb 12.8 oz (126 kg)   PF 98 L/min   BMI 44.84 kg/m?    ? ?Wt Readings from Last 3 Encounters:  ?10/19/21 277 lb 12.8 oz (126 kg)  ?03/22/21 277 lb 8 oz (125.9 kg)  ?02/25/21 277 lb 12.8 oz (126 kg)  ?  ? ?VZD:GLOV nourished, well developed in no acute distress ?HEENT: Normal ?NECK: No JVD; No carotid bruits ?CARDIAC: RRR, no murmurs, rubs, gallops ?RESPIRATORY:  Clear to auscultation without rales, wheezing or rhonchi  ?ABDOMEN: Soft, non-tender, non-distended ?MUSCULOSKELETAL:  No edema; No deformity  ?SKIN: Warm and dry ?NEUROLOGIC:  Alert and oriented x 3 ?PSYCHIATRIC:  Normal affect  ? ?ASSESSMENT:   ? ?1. Near syncope   ?2. Essential hypertension   ? ? ?  PLAN:   ? ?Near syncope: Reports episodes of tachycardia and near syncope since COVID-19 infection.  Echocardiogram on 12/07/2020 showed normal biventricular function, no significant valvular disease.  Zio patch x12 days on 10/24/2020 showed no significant arrhythmias.  Patient triggered events corresponded to sinus rhythm.  No further cardiac work-up recommended. ?  ?Hypertension: On losartan-HCTZ 100-25 mg daily, reports has been on these medications for years.  Unusual to have hypertension at such a young age, concerning for secondary causes.   ?-OCPs may also be contributing ?-She reports was diagnosed with hypercortisolism in high school but was not started on treatment and does not follow with endocrinology.  Referred to endocrinology, 24-hour urine cortisol was normal ?-Renal artery duplex on 12/24/2020 showed 1 to 59% stenosis of right renal artery, no left renal artery stenosis.   ?-Sleep study 12/17/2020 showed no evidence of OSA ?-Renin/aldosterone showed no evidence of hyperaldosteronism ?-Continue losartan-HCTZ ? ?Daytime somnolence: Sleep study showed no OSA ?  ?RTC in 1 year ? ? ?Medication Adjustments/Labs and Tests Ordered: ?Current medicines are reviewed at length with the patient today.  Concerns regarding medicines  are outlined above.  ?Orders Placed This Encounter  ?Procedures  ? EKG 12-Lead  ? ?No orders of the defined types were placed in this encounter. ? ? ?Patient Instructions  ?Medication Instructions:  ?Your p

## 2021-10-19 ENCOUNTER — Ambulatory Visit: Payer: BC Managed Care – PPO | Admitting: Cardiology

## 2021-10-19 ENCOUNTER — Other Ambulatory Visit: Payer: Self-pay

## 2021-10-19 ENCOUNTER — Encounter: Payer: Self-pay | Admitting: Cardiology

## 2021-10-19 VITALS — BP 138/84 | HR 104 | Ht 66.0 in | Wt 277.8 lb

## 2021-10-19 DIAGNOSIS — R55 Syncope and collapse: Secondary | ICD-10-CM | POA: Diagnosis not present

## 2021-10-19 DIAGNOSIS — I10 Essential (primary) hypertension: Secondary | ICD-10-CM | POA: Diagnosis not present

## 2021-10-19 NOTE — Patient Instructions (Signed)
Medication Instructions:  Your physician recommends that you continue on your current medications as directed. Please refer to the Current Medication list given to you today.  *If you need a refill on your cardiac medications before your next appointment, please call your pharmacy*  Follow-Up: At [facility], you and your health needs are our priority.  As part of our continuing mission to provide you with exceptional heart care, we have created designated Provider Care Teams.  These Care Teams include your primary Cardiologist (physician) and Advanced Practice Providers (APPs -  Physician Assistants and Nurse Practitioners) who all work together to provide you with the care you need, when you need it.  We recommend signing up for the patient portal called "MyChart".  Sign up information is provided on this After Visit Summary.  MyChart is used to connect with patients for Virtual Visits (Telemedicine).  Patients are able to view lab/test results, encounter notes, upcoming appointments, etc.  Non-urgent messages can be sent to your provider as well.   To learn more about what you can do with MyChart, go to [email or web address].    Your next appointment:   12 month(s)  The format for your next appointment:   In Person  Provider:   Dr. Schumann    

## 2022-02-13 ENCOUNTER — Ambulatory Visit: Payer: BC Managed Care – PPO | Admitting: Allergy & Immunology

## 2022-02-13 ENCOUNTER — Encounter: Payer: Self-pay | Admitting: Allergy & Immunology

## 2022-02-13 VITALS — BP 142/76 | HR 102 | Temp 98.7°F | Resp 18 | Ht 66.0 in | Wt 278.4 lb

## 2022-02-13 DIAGNOSIS — R5383 Other fatigue: Secondary | ICD-10-CM

## 2022-02-13 DIAGNOSIS — U099 Post covid-19 condition, unspecified: Secondary | ICD-10-CM

## 2022-02-13 DIAGNOSIS — K9049 Malabsorption due to intolerance, not elsewhere classified: Secondary | ICD-10-CM | POA: Diagnosis not present

## 2022-02-13 NOTE — Patient Instructions (Addendum)
1. Food intolerance - Testing to the entire food panel was negative. - There is a the low positive predictive value of food allergy testing and hence the high possibility of false positives. - In contrast, food allergy testing has a high negative predictive value, therefore if testing is negative we can be relatively assured that they are indeed negative.  - This could just be an intolerance. - I am going to get a serum tryptase to rule out mast cell disease as well as another alpha gal (maybe your body needed time to seroconvert and the testing will be positive?). - I would change from Claritin to Allegra once daily (this tends to be a bit stronger than Claritin from an antihistamine perspective and it still has less of the sedation, similar to Claritin).  2. Return in about 3 months (around 05/16/2022).    Please inform us of any Emergency Department visits, hospitalizations, or changes in symptoms. Call us before going to the ED for breathing or allergy symptoms since we might be able to fit you in for a sick visit. Feel free to contact us anytime with any questions, problems, or concerns.  It was a pleasure to meet you and your mother today!  Websites that have reliable patient information: 1. American Academy of Asthma, Allergy, and Immunology: www.aaaai.org 2. Food Allergy Research and Education (FARE): foodallergy.org 3. Mothers of Asthmatics: http://www.asthmacommunitynetwork.org 4. American College of Allergy, Asthma, and Immunology: www.acaai.org   COVID-19 Vaccine Information can be found at: ShippingScam.co.uk For questions related to vaccine distribution or appointments, please email [email or web address] or call [phone].   We realize that you might be concerned about having an allergic reaction to the COVID19 vaccines. To help with that concern, WE ARE OFFERING THE COVID19 VACCINES IN OUR OFFICE! Ask the front desk  for dates!     "Like" [place] on Facebook and Instagram for our latest updates!      A healthy democracy works best when [employer] participate! Make sure you are registered to vote! If you have moved or changed any of your contact information, you will need to get this updated before voting!  In some cases, you MAY be able to register to vote online: [email or web address]        Food Adult Perc - 02/13/22 1500     Time Antigen Placed 1524    Allergen Manufacturer Lavella Hammock    Location Back    Number of allergen test 72     Control-buffer 50% Glycerol Negative    Control-Histamine 1 mg/ml 3+    1. Peanut Negative    2. Soybean Negative    3. Wheat Negative    4. Sesame Negative    5. Milk, cow Negative    6. Egg White, Chicken Negative    7. Casein Negative    8. Shellfish Mix Negative    9. Fish Mix Negative    10. Cashew Negative    11. Pecan Food Negative    12. Vermilion Negative    13. Almond Negative    14. Hazelnut Negative    15. [place] nut Negative    16. Coconut Negative    17. Pistachio Negative    18. Catfish Negative    19. Bass Negative    20. Trout Negative    21. Tuna Negative    22. Salmon Negative    23. Flounder Negative    24. Codfish Negative    25. Shrimp Negative    26. Crab Negative  27. Lobster Negative    28. Oyster Negative    29. Scallops Negative    30. Barley Negative    31. Oat  Negative    32. Rye  Negative    33. Hops Negative    34. Rice Negative    35. Cottonseed Negative    36. Saccharomyces Cerevisiae  Negative    37. Pork Negative    38. [place] Meat Negative    39. Chicken Meat Negative    40. Beef Negative    41. Lamb Negative    42. Tomato Negative    43. White Potato Negative    44. Sweet Potato Negative    45. Pea, Green/English Negative    46. Navy Bean Negative    47. Mushrooms Negative    48. Avocado Negative    49. Onion Negative    50. Cabbage Negative    51. Carrots  Negative    52. Celery Negative    53. Corn Negative    54. Cucumber Negative    55. Grape (White seedless) Negative    56. Orange  Negative    57. Banana Negative    58. Apple Negative    59. Peach Negative    60. Strawberry Negative    61. Cantaloupe Negative    62. Watermelon Negative    63. Pineapple Negative    64. Chocolate/Cacao bean Negative    65. Karaya Gum Negative    66. Acacia (Arabic Gum) Negative    67. Cinnamon Negative    68. Nutmeg Negative    69. Ginger Negative    70. Garlic Negative    71. Pepper, black Negative    72. Mustard Negative

## 2022-02-13 NOTE — Progress Notes (Signed)
NEW PATIENT  Date of Service/Encounter:  02/13/22  Consult requested by: Lavella Lemons, PA   Assessment:   No diagnosis found.  Long COVID syndrome   Plan/Recommendations:    There are no Patient Instructions on file for this visit.   {Blank single:19197::"This note in its entirety was forwarded to the Provider who requested this consultation."}  Subjective:   Summer Flores is a 31 y.o. female presenting today for evaluation of  Chief Complaint  Patient presents with  . Allergic Reaction    Cannot eat red meat anymore. By next morning dizzy, fatigue, headache. Can eat pepperoni and feels fine. Had an alpha gal test done and came back negative about 4-6 months ago.  . Allergic Rhinitis     Summer Flores has a history of the following: Patient Active Problem List   Diagnosis Date Noted  . Chronic nonintractable headache 03/22/2021  . Tinnitus 03/22/2021  . Post covid-19 condition, unspecified 03/22/2021  . Weight gain 02/25/2021  . COVID-19 10/01/2020  . Atypical chest pain 09/10/2016  . Shortness of breath 09/10/2016    History obtained from: chart review and {Persons; PED relatives w/patient:19415::"patient"}.  Summer Flores was referred by Lavella Lemons, PA.     Summer Flores is a 31 y.o. female presenting for {Blank single:19197::"a food challenge","a drug challenge","skin testing","a sick visit","an evaluation of ***","a follow up visit"}. She had COVID in January 2022. Everything started happening at this time. She reacted to black beans at a restaurant. She is sluggish the day afterward. Symptoms last for hours and then resolve with treatment with ibuprofen. Now she does not even touch beef.   She still wakes up sluggish sometimes, but beef specifically makes her feel "like crap". She wakes up without the energy, but the entire day, she feels like crap. She has some bloatedness and dizziness. This does not happen until the next morning.   She does not  remember getting any tick bites in the last few   Vitamin D and iron and B12 are all messed up since COVID 19.   She had alpha gal testing done around 4-6 months ago and this was negative.   She has seen GI, Cardiology, ENT, Neurology, Hematology for her COVID symptoms.   She does take Claritin since COVID19 since she read that it helps with long [place].   She has bene evaluated for IBS and this workup was negative, per the patient. She is wondering if there is some other sensitivity or allergy that is going on. She has not had an endoscopy or colonoscopy.      {Blank single:19197::"Asthma/Respiratory Symptom History: ***"," "}  {Blank single:19197::"Allergic Rhinitis Symptom History: ***"," "}  {Blank single:19197::"Food Allergy Symptom History: ***"," "}  {Blank single:19197::"Skin Symptom History: ***"," "}  {Blank single:19197::"GERD Symptom History: ***"," "}  ***Otherwise, there is no history of other atopic diseases, including {Blank multiple:19196:o:"asthma","food allergies","drug allergies","environmental allergies","stinging insect allergies","eczema","urticaria","contact dermatitis"}. There is no significant infectious history. ***Vaccinations are up to date.    Past Medical History: Patient Active Problem List   Diagnosis Date Noted  . Chronic nonintractable headache 03/22/2021  . Tinnitus 03/22/2021  . Post covid-19 condition, unspecified 03/22/2021  . Weight gain 02/25/2021  . COVID-19 10/01/2020  . Atypical chest pain 09/10/2016  . Shortness of breath 09/10/2016    Medication List:  Allergies as of 02/13/2022       Reactions   Latex         Medication List  Accurate as of February 13, 2022  2:57 PM. If you have any questions, ask your nurse or doctor.          ALPRAZolam 0.5 MG tablet Commonly known as: XANAX Take by mouth as needed.   cephALEXin 500 MG capsule Commonly known as: KEFLEX as needed.   clobetasol cream 0.05 % Commonly  known as: TEMOVATE Apply topically 2 (two) times daily.   Iron 325 (65 Fe) MG Tabs 1 tablet   loratadine 10 MG tablet Commonly known as: CLARITIN   losartan-hydrochlorothiazide 100-25 MG tablet Commonly known as: HYZAAR Take 1 tablet by mouth daily.   Nortrel 1/35 (28) tablet Generic drug: norethindrone-ethinyl estradiol 1/35 See admin instructions.   nortriptyline 10 MG capsule Commonly known as: PAMELOR Take 3 capsules (30 mg total) by mouth at bedtime.   pantoprazole 40 MG tablet Commonly known as: PROTONIX pantoprazole 40 mg tablet,delayed release  TAKE 1 TABLET BY MOUTH DAILY   PROBIOTIC DAILY PO Take 1 capsule by mouth daily.   sertraline 25 MG tablet Commonly known as: ZOLOFT Take 25 mg by mouth daily.   SUMAtriptan 50 MG tablet Commonly known as: Imitrex Take 1 tablet (50 mg total) by mouth every 2 (two) hours as needed for migraine. May repeat in 2 hours if headache persists or recurs.   TURMERIC PO Take 1 tablet by mouth daily.   vitamin B-12 1000 MCG tablet Commonly known as: CYANOCOBALAMIN Take 1,000 mcg by mouth daily.   Vitamin C 500 MG Caps   Vitamin D (Ergocalciferol) 1.25 MG (50000 UNIT) Caps capsule Commonly known as: DRISDOL Take 50,000 Units by mouth once a week.        Birth History: {Blank single:19197::"non-contributory","born premature and spent time in the NICU","born at term without complications"}  Developmental History: [place] has met all milestones on time. She has required no {Blank multiple:19196:a:"speech therapy","occupational therapy","physical therapy"}. ***non-contributory  Past Surgical History: Past Surgical History:  Procedure Laterality Date  . ADENOIDECTOMY    . FINGER SURGERY    . MYRINGOTOMY    . TONSILLECTOMY       Family History: Family History  Problem Relation Age of Onset  . Asthma Mother   . Hypertension Father   . Eczema Brother   . Hypertension Maternal Grandfather   . Hypertension Paternal  Grandmother   . Heart attack Cousin   . Adrenal disorder Neg Hx      Social History: Rashana lives at home with ***.  He was in a house of unknown age.  There is wood flooring throughout the home as well as the bedroom.  She has gas heating and central cooling.  There are no animals inside or outside of the home.  There are no dust mite covers on the bedding.  There is no tobacco exposure.  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  She has done this since February 2023.  He is not exposed to fumes, chemicals, or dust.  She does not live in the state or industrial area.  There is no tobacco exposure. She is working for [employer].    ROS     Objective:   Blood pressure (!) 142/76, pulse (!) 102, temperature 98.7 F (37.1 C), resp. rate 18, height _0  (1.676 m), weight 278 lb 6 oz (126.3 kg), SpO2 96 %. Body mass index is 44.93 kg/m.     Physical Exam   Diagnostic  studies: {Blank single:19197::"none","deferred due to recent antihistamine use","labs sent instead"," "}  Spirometry: {Blank single:19197::"results normal (FEV1: ***%, FVC: ***%, FEV1/FVC: ***%)","results abnormal (FEV1: ***%, FVC: ***%, FEV1/FVC: ***%)"}.    {Blank single:19197::"Spirometry consistent with mild obstructive disease","Spirometry consistent with moderate obstructive disease","Spirometry consistent with severe obstructive disease","Spirometry consistent with possible restrictive disease","Spirometry consistent with mixed obstructive and restrictive disease","Spirometry uninterpretable due to technique","Spirometry consistent with normal pattern"}. {Blank single:19197::"Albuterol/Atrovent nebulizer","Xopenex/Atrovent nebulizer","Albuterol nebulizer","Albuterol four puffs via MDI","Xopenex four puffs via MDI"} treatment given in clinic with {Blank single:19197::"significant improvement in FEV1 per ATS criteria","significant  improvement in FVC per ATS criteria","significant improvement in FEV1 and FVC per ATS criteria","improvement in FEV1, but not significant per ATS criteria","improvement in FVC, but not significant per ATS criteria","improvement in FEV1 and FVC, but not significant per ATS criteria","no improvement"}.  Allergy Studies: {Blank single:19197::"none","labs sent instead"," "}    {Blank single:19197::"Allergy testing results were read and interpreted by myself, documented by clinical staff."," "}         Salvatore Marvel, MD [facility] [place] [facility]

## 2022-02-14 ENCOUNTER — Encounter: Payer: Self-pay | Admitting: Allergy & Immunology

## 2022-05-31 ENCOUNTER — Ambulatory Visit: Payer: BC Managed Care – PPO | Admitting: Allergy & Immunology

## 2022-08-26 ENCOUNTER — Emergency Department (HOSPITAL_BASED_OUTPATIENT_CLINIC_OR_DEPARTMENT_OTHER)
Admission: EM | Admit: 2022-08-26 | Discharge: 2022-08-26 | Disposition: A | Discharge status: Home / Self Care | Payer: BC Managed Care – PPO | Attending: Emergency Medicine | Admitting: Emergency Medicine

## 2022-08-26 ENCOUNTER — Other Ambulatory Visit: Payer: Self-pay

## 2022-08-26 ENCOUNTER — Emergency Department (HOSPITAL_BASED_OUTPATIENT_CLINIC_OR_DEPARTMENT_OTHER): Payer: BC Managed Care – PPO

## 2022-08-26 ENCOUNTER — Encounter (HOSPITAL_BASED_OUTPATIENT_CLINIC_OR_DEPARTMENT_OTHER): Payer: Self-pay | Admitting: Emergency Medicine

## 2022-08-26 DIAGNOSIS — Z91040 Latex allergy status: Secondary | ICD-10-CM | POA: Insufficient documentation

## 2022-08-26 DIAGNOSIS — W010XXA Fall on same level from slipping, tripping and stumbling without subsequent striking against object, initial encounter: Secondary | ICD-10-CM | POA: Insufficient documentation

## 2022-08-26 DIAGNOSIS — S0990XA Unspecified injury of head, initial encounter: Secondary | ICD-10-CM

## 2022-08-26 DIAGNOSIS — R519 Headache, unspecified: Secondary | ICD-10-CM | POA: Diagnosis present

## 2022-08-26 LAB — PREGNANCY, URINE: Preg Test, Ur: NEGATIVE

## 2022-08-26 NOTE — ED Triage Notes (Signed)
Fell while standing on 5 gallon bucket. Happened around 1pm Hit top of head and forehead.\ Headache and pain at site. Denies loc, gcs 15 aox4 no neck pain

## 2022-08-26 NOTE — ED Provider Notes (Signed)
[facility] Provider Note   CSN: [id number] Arrival date & time: 08/26/22  1436     History  Chief Complaint  Patient presents with   Summer Flores is a 32 y.o. female.  The history is provided by the patient, a relative and medical records. No language interpreter was used.  Fall     32 year old female presenting for evaluation of a fall.  Patient report earlier in the day she was painting her house standing on a 5 gallon bucket when it tipped causing her to fell backward striking her head against the ground.  She denies any loss of consciousness but did notice her eyes lids with twitching which concerns her family.  She does endorse a throbbing headache moderate intensity but otherwise denies any vision changes, nausea, vomiting, confusion, light or sound sensitivity, pain anywhere else.  Her last menstrual period was 3 weeks ago.  She is not on any blood thinner medication.  She denies any specific treatment tried.  Home Medications Prior to Admission medications   Medication Sig Start Date End Date Taking? Authorizing Provider  ALPRAZolam Duanne Moron) 0.5 MG tablet Take by mouth as needed. Patient not taking: Reported on 02/13/2022 10/17/21   [provider]  Ascorbic Acid (VITAMIN C) 500 MG CAPS  10/06/20   [provider]  cephALEXin (KEFLEX) 500 MG capsule as needed. 02/12/20   [provider]  clobetasol cream (TEMOVATE) 0.05 % Apply topically 2 (two) times daily. Patient not taking: Reported on 02/13/2022 12/05/21   [provider]  Ferrous Sulfate (IRON) 325 (65 Fe) MG TABS 1 tablet    [provider]  loratadine (CLARITIN) 10 MG tablet  12/29/20   [provider]  losartan-hydrochlorothiazide (HYZAAR) 100-25 MG tablet Take 1 tablet by mouth daily. 08/17/20   [provider]  norethindrone-ethinyl estradiol 1/35 ([place] 1/35, 28,) tablet See admin instructions.     [provider]  nortriptyline (PAMELOR) 10 MG capsule Take 3 capsules (30 mg total) by mouth at bedtime. Patient not taking: Reported on 10/19/2021 03/22/21   Marcial Pacas, MD  pantoprazole (PROTONIX) 40 MG tablet pantoprazole 40 mg tablet,delayed release  TAKE 1 TABLET BY MOUTH DAILY    [provider]  Probiotic Product (PROBIOTIC DAILY PO) Take 1 capsule by mouth daily.    [provider]  sertraline (ZOLOFT) 25 MG tablet Take 25 mg by mouth daily. 10/17/21   [provider]  SUMAtriptan (IMITREX) 50 MG tablet Take 1 tablet (50 mg total) by mouth every 2 (two) hours as needed for migraine. May repeat in 2 hours if headache persists or recurs. Patient not taking: Reported on 10/19/2021 03/22/21   Marcial Pacas, MD  TURMERIC PO Take 1 tablet by mouth daily.    [provider]  vitamin B-12 (CYANOCOBALAMIN) 1000 MCG tablet Take 1,000 mcg by mouth daily. 09/28/20   [provider]  Vitamin D, Ergocalciferol, (DRISDOL) 1.25 MG (50000 UNIT) CAPS capsule Take 50,000 Units by mouth once a week. 09/28/20   [provider]      Allergies    Latex    Review of Systems   Review of Systems  All other systems reviewed and are negative.   Physical Exam Updated Vital Signs BP (!) 147/70 (BP Location: Right Arm)   Pulse 74   Temp 97.9 F (36.6 C)   Resp 18   LMP 08/04/2022   SpO2 100%  Physical Exam Vitals  and nursing note reviewed.  Constitutional:      General: She is not in acute distress.    Appearance: She is well-developed.  HENT:     Head: Normocephalic and atraumatic.     Comments: Mild tenderness to occipital scalp but no bruising or overlying skin changes noted. Eyes:     Conjunctiva/sclera: Conjunctivae normal.  Pulmonary:     Effort: Pulmonary effort is normal.  Musculoskeletal:     Cervical back: Normal range of motion and neck supple. No rigidity or tenderness.  Skin:    Findings: No rash.  Neurological:     Mental  Status: She is alert and oriented to person, place, and time. Mental status is at baseline.     GCS: GCS eye subscore is 4. GCS verbal subscore is 5. GCS motor subscore is 6.     Cranial Nerves: Cranial nerves 2-12 are intact.     Sensory: Sensation is intact.     Motor: Motor function is intact.     Coordination: Coordination is intact.     Gait: Gait is intact.  Psychiatric:        Mood and Affect: Mood normal.     ED Results / Procedures / Treatments   Labs (all labs ordered are listed, but only abnormal results are displayed) Labs Reviewed  PREGNANCY, URINE    EKG None  Radiology CT Cervical Spine Wo Contrast  Result Date: 08/26/2022 CLINICAL DATA:  Neck trauma, dangerous injury mechanism (Age 34-64y) Fall, hit head. EXAM: CT CERVICAL SPINE WITHOUT CONTRAST TECHNIQUE: Multidetector CT imaging of the cervical spine was performed without intravenous contrast. Multiplanar CT image reconstructions were also generated. RADIATION DOSE REDUCTION: This exam was performed according to the departmental dose-optimization program which includes automated exposure control, adjustment of the mA and/or kV according to patient size and/or use of iterative reconstruction technique. COMPARISON:  None Available. FINDINGS: Alignment: Straightening of normal lordosis. No traumatic subluxation. Skull base and vertebrae: No acute fracture. Vertebral body heights are maintained. The dens and skull base are intact. Soft tissues and spinal canal: No prevertebral fluid or swelling. No visible canal hematoma. Disc levels:  Preserved. Upper chest: No acute or unexpected findings. Other: None. IMPRESSION: Straightening of normal lordosis may be due to positioning or muscle spasm. No acute fracture or traumatic subluxation of the cervical spine. Electronically Signed   By: Keith Rake M.D.   On: 08/26/2022 16:56   CT Head Wo Contrast  Result Date: 08/26/2022 CLINICAL DATA:  Head trauma, moderate-severe Fall  hitting top of head. Headache. EXAM: CT HEAD WITHOUT CONTRAST TECHNIQUE: Contiguous axial images were obtained from the base of the skull through the vertex without intravenous contrast. RADIATION DOSE REDUCTION: This exam was performed according to the departmental dose-optimization program which includes automated exposure control, adjustment of the mA and/or kV according to patient size and/or use of iterative reconstruction technique. COMPARISON:  Head CT 09/21/2020 FINDINGS: Brain: No intracranial hemorrhage, mass effect, or midline shift. No hydrocephalus. The basilar cisterns are patent. No evidence of territorial infarct or acute ischemia. No extra-axial or intracranial fluid collection. Vascular: No hyperdense vessel or unexpected calcification. Skull: Normal. Negative for fracture or focal lesion. Sinuses/Orbits: Paranasal sinuses and mastoid air cells are clear. The visualized orbits are unremarkable. Other: No large scalp hematoma. IMPRESSION: Negative noncontrast head CT. Electronically Signed   By: Keith Rake M.D.   On: 08/26/2022 16:52    Procedures Procedures    Medications Ordered in ED Medications - No data  to display  ED Course/ Medical Decision Making/ A&P                             Medical Decision Making  BP (!) 147/70 (BP Location: Right Arm)   Pulse 74   Temp 97.9 F (36.6 C)   Resp 18   LMP 08/04/2022   SpO2 100%   40:78 PM  32 year old female presenting for evaluation of a fall.  Patient report earlier in the day she was painting her house standing on a 5 gallon bucket when it tipped causing her to fell backward striking her head against the ground.  She denies any loss of consciousness but did notice her eyes lids with twitching which concerns her family.  She does endorse a throbbing headache moderate intensity but otherwise denies any vision changes, nausea, vomiting, confusion, light or sound sensitivity, pain anywhere else.  Her last menstrual period was 3  weeks ago.  She is not on any blood thinner medication.  She denies any specific treatment tried.  On exam this is a well-appearing female sitting in the chair appears to be in no acute discomfort.  She has some mild tenderness to the occipital scalp but otherwise no bruising crepitus or any overlying skin changes noted.  She does not have any midline spine tenderness.  No other reproducible tenderness on exam.  She is mentating appropriately ambulating without difficulty and without any focal numbness or focal weakness.  She is alert and oriented x 4.  -Labs ordered, independently viewed and interpreted by me.  Labs remarkable for negative preg test -The patient was maintained on a cardiac monitor.  I personally viewed and interpreted the cardiac monitored which showed an underlying rhythm of: NSR -Imaging independently viewed and interpreted by me and I agree with radiologist's interpretation.  Result remarkable for head and cervical spine CT neg for acute injury -This patient presents to the ED for concern of fall, this involves an extensive number of treatment options, and is a complaint that carries with it a high risk of complications and morbidity.  The differential diagnosis includes head injury, concussion, fracture, contusion, strain, sprain -Co morbidities that complicate the patient evaluation includes none -Treatment includes none -Reevaluation of the patient after these medicines showed that the patient improved -PCP office notes or outside notes reviewed -Escalation to admission/observation considered: patients feels much better, is comfortable with discharge, and will follow up with PCP -Prescription medication considered, patient comfortable with OTC tylenol/ibuprofen -Social Determinant of Health considered Pain medication offered but patient declined.         Final Clinical Impression(s) / ED Diagnoses Final diagnoses:  Minor head injury without loss of consciousness,  initial encounter    Rx / DC Orders ED Discharge Orders     None         Domenic Moras, PA-C 08/26/22 2039    Tegeler, Gwenyth Allegra, MD 08/26/22 2045

## 2023-05-03 ENCOUNTER — Ambulatory Visit: Payer: Medicaid Other | Attending: Cardiology | Admitting: Cardiology

## 2023-05-03 ENCOUNTER — Encounter: Payer: Self-pay | Admitting: Cardiology

## 2023-05-03 VITALS — BP 128/76 | HR 100 | Ht 66.0 in | Wt 273.0 lb

## 2023-05-03 DIAGNOSIS — I10 Essential (primary) hypertension: Secondary | ICD-10-CM | POA: Diagnosis not present

## 2023-05-03 DIAGNOSIS — R55 Syncope and collapse: Secondary | ICD-10-CM

## 2023-05-03 NOTE — Patient Instructions (Signed)
Medication Instructions:   No changes  *If you need a refill on your cardiac medications before your next appointment, please call your pharmacy*   Lab Work: Not needed    Testing/Procedures: Not  needed   Follow-Up: At [facility], you and your health needs are our priority.  As part of our continuing mission to provide you with exceptional heart care, we have created designated Provider Care Teams.  These Care Teams include your primary Cardiologist (physician) and Advanced Practice Providers (APPs -  Physician Assistants and Nurse Practitioners) who all work together to provide you with the care you need, when you need it.     Your next appointment:   12 month(s)  The format for your next appointment:   In Person  Provider:   Little Ishikawa, MD   You have been referred to  [facility] weight [facility] wellness - for weight loss

## 2023-05-03 NOTE — Progress Notes (Signed)
Cardiology Office Note:    Date:  05/03/2023   ID:  Summer Flores, DOB [birth year], MRN [id number]  PCP:  Lovey Newcomer, PA  Cardiologist:  Little Ishikawa, MD  Electrophysiologist:  None   Referring MD: Lovey Newcomer, [place]   Chief Complaint  Patient presents with   Hypertension    History of Present Illness:    Summer Flores is a 32 y.o. female with a hx of recent COVID-19 infection who presents for follow-up.  She was referred by Angus Seller, NP for evaluation of tachycardia, initially seen on 10/29/2020.  She tested positive for COVID-19 on 09/05/2020.  Subsequently began having episodes of tachycardia near syncopal episodes.  Echocardiogram on 12/07/2020 showed normal biventricular function, no significant valvular disease.  Zio patch x12 days on 10/24/2020 showed no significant arrhythmias.  Patient triggered events corresponded to sinus rhythm.  Renal artery duplex on 12/24/2020 showed 1 to 59% stenosis of right renal artery, no left renal artery stenosis.  Since last clinic visit, she reports she is doing okay.  Reports went to [facility] last week and walked 20,000 steps, denies any chest pain or dyspnea.  Reports sometimes will feel like her heart is racing.  Denies any syncopal episodes.   BP Readings from Last 3 Encounters:  05/03/23 128/76  08/26/22 (!) 145/87  02/13/22 (!) 142/76    Past Medical History:  Diagnosis Date   Angio-edema    Atypical chest pain 09/10/2016   Atypical nevus 09/12/2013   left forehead (severe), Right mid back (mild), right lower back (mild)   Atypical nevus 09/09/2014   right outer abdomen (mild), right side neck (mild), left upper arm (moderate),    Atypical nevus 09/24/2015   left anterior shin (moderate), left outer back (moderate)   Atypical nevus 09/26/2017   right back outer (mild), left lower thigh (severe), left upper thigh (moderate), left chest (moderate)   Atypical nevus 09/17/2018   right mid paraspinal (moderate), right  inner shin (moderate)   GERD (gastroesophageal reflux disease)    Hypertension    Insulin resistance    Shortness of breath 09/10/2016    Past Surgical History:  Procedure Laterality Date   ADENOIDECTOMY     FINGER SURGERY     MYRINGOTOMY     TONSILLECTOMY      Current Medications: Current Meds  Medication Sig   Ascorbic Acid (VITAMIN C) 500 MG CAPS    Ferrous Sulfate (IRON) 325 (65 Fe) MG TABS Take 325 mg by mouth daily at 6 (six) AM.   loratadine (CLARITIN) 10 MG tablet Take 10 mg by mouth daily.   losartan-hydrochlorothiazide (HYZAAR) 100-25 MG tablet Take 1 tablet by mouth daily.   norethindrone-ethinyl estradiol 1/35 (NORTREL 1/35, 28,) tablet See admin instructions.   pantoprazole (PROTONIX) 40 MG tablet pantoprazole 40 mg tablet,delayed release  TAKE 1 TABLET BY MOUTH DAILY   sertraline (ZOLOFT) 25 MG tablet Take 25 mg by mouth daily.   vitamin B-12 (CYANOCOBALAMIN) 1000 MCG tablet Take 1,000 mcg by mouth daily.   Vitamin D, Ergocalciferol, (DRISDOL) 1.25 MG (50000 UNIT) CAPS capsule Take 50,000 Units by mouth once a week.     Allergies:   Latex   Social History   Socioeconomic History   Marital status: Single    Spouse name: Not on file   Number of children: Not on file   Years of education: Not on file   Highest education level: Not on file  Occupational History   Not on  file  Tobacco Use   Smoking status: Never   Smokeless tobacco: Never  Substance and Sexual Activity   Alcohol use: Yes    Comment: socially   Drug use: No   Sexual activity: Not on file  Other Topics Concern   Not on file  Social History Narrative   Lives alone   Left Handed   Drinks 8-9 cups caffeine daily   Social Determinants of Health   Financial Resource Strain: Low Risk  (12/09/2020)   Received from [facility], [facility]   Overall Financial Resource Strain (CARDIA)    Difficulty of Paying Living Expenses: Not very hard  Food Insecurity: No Food Insecurity  (12/09/2020)   Received from [facility], [facility]   Hunger Vital Sign    Worried About Running Out of Food in the Last Year: Never true    Ran Out of Food in the Last Year: Never true  Transportation Needs: No Transportation Needs (12/09/2020)   Received from [facility], [facility]   [facility] - Transportation    Lack of Transportation (Medical): No    Lack of Transportation (Non-Medical): No  Physical Activity: Inactive (12/09/2020)   Received from [facility], [facility]   Exercise Vital Sign    Days of Exercise per Week: 0 days    Minutes of Exercise per Session: 0 min  Stress: No Stress Concern Present (12/09/2020)   Received from [facility], [facility] - Occupational Stress Questionnaire    Feeling of Stress : Not at all  Social Connections: Moderately Isolated (12/09/2020)   Received from [facility], [facility]   Social Connection and Isolation Panel [NHANES]    Frequency of Communication with Friends and Family: More than three times a week    Frequency of Social Gatherings with Friends and Family: More than three times a week    Attends Religious Services: More than 4 times per year    Active Member of [employer] or Organizations: No    Attends Banker Meetings: Never    Marital Status: Never married     Family History: The patient's family history includes Asthma in her mother; Eczema in her brother; Heart attack in her cousin; Hypertension in her father, maternal grandfather, and paternal grandmother. There is no history of Adrenal disorder.  ROS:   Please see the history of present illness.    All other systems reviewed and are negative.  EKGs/Labs/Other Studies Reviewed:    The following studies were reviewed today:  Echo 12/07/2020: 1. Left ventricular ejection fraction, by estimation, is 65 to 70%. The  left ventricle has normal function. The left ventricle has no  regional  wall motion abnormalities. Left ventricular diastolic parameters were  normal.   2. Right ventricular systolic function is normal. The right ventricular  size is normal. Tricuspid regurgitation signal is inadequate for assessing  PA pressure.   3. The mitral valve is normal in structure. No evidence of mitral valve  regurgitation. No evidence of mitral stenosis.   4. The aortic valve was not well visualized. Aortic valve regurgitation  is not visualized. No aortic stenosis is present.   5. The inferior vena cava is normal in size with greater than 50%  respiratory variability, suggesting right atrial pressure of 3 mmHg.   Echo 09/25/2016: - Left ventricle: The cavity size was normal. Wall thickness was  normal. Systolic function was normal. The estimated ejection    fraction was in the range of 55% to 60%. Wall motion was normal;    there were no regional wall motion abnormalities. Left    ventricular diastolic function parameters were normal.    EKG:   10/29/1020: normal sinus rhythm, rate 92, no ST abnormalities 05/03/2023: Normal sinus rhythm, rate 100, no ST abnormalities   Recent Labs: No results found for requested labs within last 365 days.  Recent Lipid Panel No results found for: "CHOL", "TRIG", "HDL", "CHOLHDL", "VLDL", "LDLCALC", "LDLDIRECT"  Physical Exam:    VS:  BP 128/76 (BP Location: Left Arm, Patient Position: Sitting, Cuff Size: Large)   Pulse 100   Ht 5\' 6"  (1.676 m)   Wt 273 lb (123.8 kg)   SpO2 99%   BMI 44.06 kg/m     Wt Readings from Last 3 Encounters:  05/03/23 273 lb (123.8 kg)  02/13/22 278 lb 6 oz (126.3 kg)  10/19/21 277 lb 12.8 oz (126 kg)     ZOX:WRUE nourished, well developed in no acute distress HEENT: Normal NECK: No JVD; No carotid bruits CARDIAC: RRR, no murmurs, rubs, gallops RESPIRATORY:  Clear to auscultation without rales, wheezing or rhonchi  ABDOMEN: Soft, non-tender, non-distended MUSCULOSKELETAL:  No edema; No  deformity  SKIN: Warm and dry NEUROLOGIC:  Alert and oriented x 3 PSYCHIATRIC:  Normal affect   ASSESSMENT:    1. Essential hypertension   2. Morbid obesity (HCC)   3. Near syncope      PLAN:    Near syncope: Reports episodes of tachycardia and near syncope since COVID-19 infection.  Echocardiogram on 12/07/2020 showed normal biventricular function, no significant valvular disease.  Zio patch x12 days on 10/24/2020 showed no significant arrhythmias.  Patient triggered events corresponded to sinus rhythm.  No further cardiac work-up recommended.   Hypertension: On losartan-HCTZ 100-25 mg daily, reports has been on these medications for years.  Unusual to have hypertension at such a young age, concerning for secondary causes.   -OCPs may also be contributing -She reports was diagnosed with hypercortisolism in high school but was not started on treatment and does not follow with endocrinology.  Referred to endocrinology, 24-hour urine cortisol was normal -Renal artery duplex on 12/24/2020 showed 1 to 59% stenosis of right renal artery, no left renal artery stenosis.   -Sleep study 12/17/2020 showed no evidence of OSA -Renin/aldosterone showed no evidence of hyperaldosteronism -Continue losartan-HCTZ  Daytime somnolence: Sleep study showed no OSA  Morbid obesity: Body mass index is 44.06 kg/m.  Refer to healthy weight and wellness   RTC in 1 year   Medication Adjustments/Labs and Tests Ordered: Current medicines are reviewed at length with the patient today.  Concerns regarding medicines are outlined above.  Orders Placed This Encounter  Procedures   Amb Ref to Medical Weight Management   EKG 12-Lead   No orders of the defined types were placed in this encounter.   Patient Instructions  Medication Instructions:   No changes  *If you need a refill on your cardiac medications before your next appointment, please call your pharmacy*   Lab Work: Not  needed    Testing/Procedures: Not  needed   Follow-Up: At [facility], you and your health needs are our priority.  As part of our continuing mission to provide you with exceptional heart care, we have created designated Provider Care Teams.  These Care Teams include your primary Cardiologist (physician) and Advanced Practice Providers (APPs -  Physician Assistants and Nurse Practitioners) who all work together to provide you with the care you need, when you need it.     Your next appointment:   12 month(s)  The format for your next appointment:   In Person  Provider:   Little Ishikawa, MD   You have been referred to  Healthy weight and wellness - for weight loss      Signed, Little Ishikawa, MD  05/03/2023 5:32 PM    [facility]

## 2023-06-05 ENCOUNTER — Ambulatory Visit (INDEPENDENT_AMBULATORY_CARE_PROVIDER_SITE_OTHER): Payer: Managed Care, Other (non HMO) | Admitting: Adult Health

## 2023-06-05 ENCOUNTER — Encounter (INDEPENDENT_AMBULATORY_CARE_PROVIDER_SITE_OTHER): Payer: Self-pay | Admitting: Adult Health

## 2023-06-05 VITALS — BP 135/79 | HR 90 | Temp 98.6°F | Ht 66.0 in | Wt 274.0 lb

## 2023-06-05 DIAGNOSIS — Z6841 Body Mass Index (BMI) 40.0 and over, adult: Secondary | ICD-10-CM | POA: Diagnosis not present

## 2023-06-05 DIAGNOSIS — Z0000 Encounter for general adult medical examination without abnormal findings: Secondary | ICD-10-CM

## 2023-06-05 DIAGNOSIS — Z0289 Encounter for other administrative examinations: Secondary | ICD-10-CM

## 2023-06-05 NOTE — Progress Notes (Signed)
Office: [phone]  /  Fax: [phone]   Initial Visit  Summer Flores was seen in clinic today to evaluate for obesity. She is interested in losing weight to improve overall health and reduce the risk of weight related complications. She presents today to review program treatment options, initial physical assessment, and evaluation.     She was referred by: Specialist  When asked what else they would like to accomplish? She states: Adopt healthier eating patterns, Improve energy levels and physical activity, Reduce number of medications, and Improve quality of life  Weight history: Summer Flores reports being "heavier" in high school, average weight 180-190s She gained >30 lbs while in college She reports her weight has fluctuated between 265-274 the last 12 months  When asked how has your weight affected you? She states: Contributed to medical problems, Contributed to orthopedic problems or mobility issues, Having fatigue, Having poor endurance, and Problems with eating patterns  Some associated conditions: Hypertension and Vitamin D Deficiency  Contributing factors: Family history, Disruption of circadian rhythm, Nutritional, Medications, Stress, Reduced physical activity, and Eating patterns  Weight promoting medications identified: Psychotropic medications and Contraceptives or hormonal therapy  Current nutrition plan: None  Current level of physical activity: Other: Dance Class Thursday night  Current or previous pharmacotherapy: Metformin and Phentermine  Response to medication: Ineffective so it was discontinued   Past medical history includes:   Past Medical History:  Diagnosis Date   Angio-edema    Atypical chest pain 09/10/2016   Atypical nevus 09/12/2013   left forehead (severe), Right mid back (mild), right lower back (mild)   Atypical nevus 09/09/2014   right outer abdomen (mild), right side neck (mild), left upper arm (moderate),    Atypical nevus 09/24/2015    left anterior shin (moderate), left outer back (moderate)   Atypical nevus 09/26/2017   right back outer (mild), left lower thigh (severe), left upper thigh (moderate), left chest (moderate)   Atypical nevus 09/17/2018   right mid paraspinal (moderate), right inner shin (moderate)   GERD (gastroesophageal reflux disease)    Hypertension    Insulin resistance    Shortness of breath 09/10/2016     Objective:   BP 135/79   Pulse 90   Temp 98.6 F (37 C)   Ht 5\' 6"  (1.676 m)   Wt 274 lb (124.3 kg)   LMP 05/15/2023   SpO2 97%   BMI 44.22 kg/m  She was weighed on the bioimpedance scale: Body mass index is 44.22 kg/m.  Peak Weight:274 , Body Fat%:50.8, Visceral Fat Rating:14, Weight trend over the last 12 months: Unchanged  General:  Alert, oriented and cooperative. Patient is in no acute distress.  Respiratory: Normal respiratory effort, no problems with respiration noted   Gait: able to ambulate independently  Mental Status: Normal mood and affect. Normal behavior. Normal judgment and thought content.   DIAGNOSTIC DATA REVIEWED:  BMET    Component Value Date/Time   NA 141 10/06/2020 1158   K 4.3 10/06/2020 1158   CL 101 10/06/2020 1158   CO2 20 10/06/2020 1158   GLUCOSE 80 10/06/2020 1158   GLUCOSE 113 (H) 09/21/2020 1929   BUN 11 10/06/2020 1158   CREATININE 0.69 10/06/2020 1158   CALCIUM 9.5 10/06/2020 1158   GFRNONAA >60 09/21/2020 1929   GFRAA >60 04/07/2015 2105   No results found for: "HGBA1C" No results found for: "INSULIN" CBC    Component Value Date/Time   WBC 8.2 10/06/2020 1158   WBC 11.2 (  H) 09/21/2020 1929   RBC 4.96 10/06/2020 1158   RBC 5.01 09/21/2020 1929   HGB 12.1 10/06/2020 1158   HCT 38.7 10/06/2020 1158   PLT 250 10/06/2020 1158   MCV 78 (L) 10/06/2020 1158   MCH 24.4 (L) 10/06/2020 1158   MCH 24.8 (L) 09/21/2020 1929   MCHC 31.3 (L) 10/06/2020 1158   MCHC 32.2 09/21/2020 1929   RDW 13.5 10/06/2020 1158   Iron/TIBC/Ferritin/  %Sat No results found for: "IRON", "TIBC", "FERRITIN", "IRONPCTSAT" Lipid Panel  No results found for: "CHOL", "TRIG", "HDL", "CHOLHDL", "VLDL", "LDLCALC", "LDLDIRECT" Hepatic Function Panel     Component Value Date/Time   PROT 7.0 10/06/2020 1158   ALBUMIN 4.2 10/06/2020 1158   AST 17 10/06/2020 1158   ALT 22 10/06/2020 1158   ALKPHOS 75 10/06/2020 1158   BILITOT 0.3 10/06/2020 1158      Component Value Date/Time   TSH 1.58 03/01/2021 0900   Assessment and Plan:   Healthcare maintenance  Morbid obesity (HCC), Starting BMI 44.3  ESTABLISH WITH HWW   Obesity Treatment / Action Plan:  Patient will work on garnering support from family and friends to begin weight loss journey. Will work on eliminating or reducing the presence of highly palatable, calorie dense foods in the home. Will complete provided nutritional and psychosocial assessment questionnaire before the next appointment. Will be scheduled for indirect calorimetry to determine resting energy expenditure in a fasting state.  This will allow [place] to create a reduced calorie, high-protein meal plan to promote loss of fat mass while preserving muscle mass. Counseled on the health benefits of losing 5%-15% of total body weight. Was counseled on nutritional approaches to weight loss and benefits of reducing processed foods and consuming plant-based foods and high quality protein as part of nutritional weight management. Was counseled on pharmacotherapy and role as an adjunct in weight management.   Obesity Education Performed Today:  She was weighed on the bioimpedance scale and results were discussed and documented in the synopsis.  We discussed obesity as a disease and the importance of a more detailed evaluation of all the factors contributing to the disease.  We discussed the importance of long term lifestyle changes which include nutrition, exercise and behavioral modifications as well as the importance of customizing  this to her specific health and social needs.  We discussed the benefits of reaching a healthier weight to alleviate the symptoms of existing conditions and reduce the risks of the biomechanical, metabolic and psychological effects of obesity.  Marily Lente appears to be in the action stage of change and states they are ready to start intensive lifestyle modifications and behavioral modifications.  30 minutes was spent today on this visit including the above counseling, pre-visit chart review, and post-visit documentation.  Reviewed by clinician on day of visit: allergies, medications, problem list, medical history, surgical history, family history, social history, and previous encounter notes pertinent to obesity diagnosis.  Monish Haliburton d. Mikenna Bunkley, NP-C

## 2023-08-07 ENCOUNTER — Ambulatory Visit (INDEPENDENT_AMBULATORY_CARE_PROVIDER_SITE_OTHER): Payer: Managed Care, Other (non HMO) | Admitting: Family Medicine

## 2023-08-20 ENCOUNTER — Ambulatory Visit (INDEPENDENT_AMBULATORY_CARE_PROVIDER_SITE_OTHER): Payer: Managed Care, Other (non HMO) | Admitting: Family Medicine

## 2023-08-21 ENCOUNTER — Ambulatory Visit (INDEPENDENT_AMBULATORY_CARE_PROVIDER_SITE_OTHER): Payer: Managed Care, Other (non HMO) | Admitting: Family Medicine

## 2023-09-03 ENCOUNTER — Ambulatory Visit (INDEPENDENT_AMBULATORY_CARE_PROVIDER_SITE_OTHER): Payer: Managed Care, Other (non HMO) | Admitting: Family Medicine

## 2023-09-13 ENCOUNTER — Ambulatory Visit (INDEPENDENT_AMBULATORY_CARE_PROVIDER_SITE_OTHER): Payer: Managed Care, Other (non HMO) | Admitting: Family Medicine

## 2023-09-13 ENCOUNTER — Encounter (INDEPENDENT_AMBULATORY_CARE_PROVIDER_SITE_OTHER): Payer: Self-pay | Admitting: Family Medicine

## 2023-09-13 VITALS — BP 135/84 | HR 84 | Temp 98.6°F | Ht 66.0 in | Wt 276.0 lb

## 2023-09-13 DIAGNOSIS — I10 Essential (primary) hypertension: Secondary | ICD-10-CM

## 2023-09-13 DIAGNOSIS — E538 Deficiency of other specified B group vitamins: Secondary | ICD-10-CM

## 2023-09-13 DIAGNOSIS — Z1331 Encounter for screening for depression: Secondary | ICD-10-CM | POA: Diagnosis not present

## 2023-09-13 DIAGNOSIS — E559 Vitamin D deficiency, unspecified: Secondary | ICD-10-CM

## 2023-09-13 DIAGNOSIS — R5383 Other fatigue: Secondary | ICD-10-CM

## 2023-09-13 DIAGNOSIS — Z6841 Body Mass Index (BMI) 40.0 and over, adult: Secondary | ICD-10-CM

## 2023-09-13 DIAGNOSIS — R0602 Shortness of breath: Secondary | ICD-10-CM | POA: Diagnosis not present

## 2023-09-13 DIAGNOSIS — F39 Unspecified mood [affective] disorder: Secondary | ICD-10-CM

## 2023-09-13 DIAGNOSIS — F5089 Other specified eating disorder: Secondary | ICD-10-CM

## 2023-09-13 NOTE — Progress Notes (Signed)
Carlye Grippe, D.O.  ABFM, ABOM Specializing in Clinical Bariatric Medicine Office located at: [address] [place], [place]  [address]   Bariatric Medicine Visit  Dear Lovey Newcomer, PA and Dr.Schumann  Thank you for referring Summer Flores to our clinic today for evaluation.  We performed a consultation to discuss her options for treatment and educate the patient on her disease state.  The following note includes my evaluation and treatment recommendations.   Please do not hesitate to reach out to me directly if you have any further concerns.   Assessment and Plan:   FOR THE DISEASE OF OBESITY: Morbid obesity (HCC), Starting BMI 44.57 Assessment & Plan:  Summer Flores is currently in the action stage of change. As such, her goal is to start our weight management plan.  She has agreed to: follow the Category 2 MP with L options and 6 ounces of lean protein at lunch &  8-10 ounces at dinner   Behavioral Intervention We discussed the following Behavioral Modification Strategies today: begin to work on maintaining a reduced calorie state, getting the recommended amount of protein, incorporating whole foods, making healthy choices, staying well hydrated and practicing mindfulness when eating.  Additional resources provided today: handout on CAT 2 meal plan and Handout on CAT 1-2 lunch options  Evidence-based interventions for health behavior change were utilized today including the discussion of self monitoring techniques, problem-solving barriers and SMART goal setting techniques.    Pt will specifically work on: n/a   Recommended Physical Activity Goals Summer Flores has been advised to work up to 150 minutes of moderate intensity aerobic activity a week and strengthening exercises 2-3 times per week for cardiovascular health, weight loss maintenance and preservation of muscle mass.   She has agreed to : maintain current level of activity.    Pharmacotherapy We both agreed to :  begin with nutritional and behavioral strategies   FOR ASSOCIATED CONDITIONS ADDRESSED TODAY:  Fatigue Assessment & Plan: Summer Flores does feel that her weight is causing her energy to be lower than it should be. Fatigue may be related to obesity, depression or many other causes. she does not appear to have any red flag symptoms and this appears to most likely be related to her current lifestyle habits and dietary intake.  Epworth sleepiness scale is 3 and appears to be within normal limits. Summer Flores admits to occasional daytime somnolence and admits to waking up still tired. Summer Flores generally gets 7 hours of sleep per night, and states that she has generally restful sleep. Snoring is not present. Apneic episodes are not present. 12/21/2020 pt had a home sleep study - findings were normal and there was no indication for CPAP.   ECG done on 05/03/2023: Reviewed/interpreted independently. Normal sinus rhythm, rate 99 bpm; reassuring without any acute abnormalities, will continue to monitor for symptoms.   Shortness of breath on exertion Assessment & Plan: Summer Flores does feel that she gets out of breath more easily than she used to when she exercises and seems to be worsening over time with weight gain.  This has gotten worse recently. Maigan denies shortness of breath at rest or orthopnea. Summer Flores's shortness of breath appears to be obesity related and exercise induced, as they do not appear to have any "red flag" symptoms/ concerns today.  Also, this condition appears to be related to a state of poor cardiovascular conditioning   Indirect Calorimeter completed today to help guide our dietary regimen. It shows a VO2 of  335 and a REE of 2318.  Her calculated basal metabolic rate is 8413 thus her resting energy expenditure is better than expected.  Patient agreed to work on weight loss at this time.  As Summer Flores progresses through our weight loss program, we will gradually increase exercise as tolerated to treat her  current condition.   If Summer Flores follows our recommendations and loses 5-10% of their weight without improvement of her shortness of breath or if at any time, symptoms become more concerning, they agree to urgently follow up with their PCP/ specialist for further consideration/ evaluation.   Summer Flores verbalizes agreement with this plan.   Orders: -     TSH -     T4, free -     T3 -     Lipid panel -     Insulin, random -     Hemoglobin A1c -     Folate -     Comprehensive metabolic panel -     CBC with Differential/Platelet   Depression Screen  Assessment & Plan: Her Food and Mood (modified PHQ-9) score was 9. Pt denies SI/HI. Depression is commonly associated with obesity and often results in emotional eating behaviors. We will monitor this closely and work on CBT to help improve the non-hunger eating patterns. Pt will be referred to bariatric psychology.    Primary hypertension- familial, onset age 59 Assessment & Plan: Last 3 blood pressure readings in our office are as follows: BP Readings from Last 3 Encounters:  09/13/23 135/84  06/05/23 135/79  05/03/23 128/76    Pt with family hx of early HTN on paternal side. She was diagnosed at 33 y.o. She is on Hyzaar 100-25 mg daily. BP is mildly elevated today - pt attributes this to possible white coat. Pt asymptomatic.   Additionally, pt tested positive for COVID-19 on 09/05/2020. Subsequently began having episodes of tachycardia near syncopal episodes. This was found to be d/t the acute illness of COVID-19. Pt is no longer dizzy nor tachycardic.   Continue antihypertensive therapy. Begin low sodium, heart healthy meal plan. We will check renal panel today and assess cardiovascular risk at the next office visit.   B12 deficiency Assessment & Plan: Onset - 2021. Condition managed with OTC Vitamin B-12 1,000 mcg daily. Continue supplement. Check labs.    Orders:  -     Vitamin B12   Vitamin D deficiency Assessment & Plan: Onset -  2021. Condition is managed by OTC ERGO 50,000 units weekly. Continue supplement. Check labs.   Orders:  -     VITAMIN D 25 Hydroxy (Vit-D Deficiency, Fractures)   Mood disorder (HCC) - Emotional Eating Assessment & Plan: Relevant meds: Zoloft 25 mg daily and Xanax prn. Summer Flores endorses having illness anxiety which began during COVID. Mood is stable today - no acute concerns. She denies having a Careers information officer. She tends to eat when sad, bored, to comfort herself, and/or as a reward. Pt was referred to Dr. Dewaine Conger. Consider obtaining counselor in the future. Continue med regimen. Begin meal plan.    FOLLOW UP:   Follow up in 2 weeks. She was informed of the importance of frequent follow up visits to maximize her success with intensive lifestyle modifications for her multiple health conditions.  Summer Flores is aware that we will review all of her lab results at our next visit.  She is aware that if anything is critical/ life threatening with the results, we will be contacting her via MyChart prior to  the office visit to discuss management.    Chief Complaint:   OBESITY Bamberg JON (MR# [id number]) is a pleasant 33 y.o. female who presents for evaluation and treatment of obesity and related comorbidities. Current BMI is Body mass index is 44.55 kg/m. Summer Flores has been struggling with her weight for many years and has been unsuccessful in either losing weight, maintaining weight loss, or reaching her healthy weight goal.  Summer Flores is currently in the action stage of change and ready to dedicate time achieving and maintaining a healthier weight. Summer Flores is interested in becoming our patient and working on intensive lifestyle modifications including (but not limited to) diet and exercise for weight loss.  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 Patient is dating Summer Flores  and does not have children. She lives with  Summer Flores, his 24 y.o daughter, his 72 y.o son, and her 46 year old cousin.   Peak weight: 280 pounds- started gaining weight after college/ after getting full time job.  Walks and or dances 1-2 times a week for 45 minutes.   Has tried Weight Watchers & Isogenix. Isogenix worked better because "it was convenient". On Isogenix, she lost 30 lbs but later regained weight.   Eats outside the home 3+ times a week.   Craves chips, Summer Flores food and pizza.   Snacks frequently on chips or sweets after dinner.   Usually skips breakfast most days.  Drinks 1-2 cans of diet soda daily, coffee with creamer and sweetener, milk, sweet tea with sugar, protein drinks, liquor seltzers or beer (1-2 drinks, 1x per month during social events)  Worst food habit - "Summer Flores and tortilla chips"  Subjective:   This is the patient's first visit at [facility].  The patient's NEW PATIENT PACKET that they filled out prior to today's office visit was reviewed at length and information from that paperwork was included within the following office visit note.    Included in the packet: current and past health history, medications, allergies, ROS, gynecologic history (women only), surgical history, family history, social history, weight history, weight loss surgery history (for those that have had weight loss surgery), nutritional evaluation, mood and food questionnaire along with a depression screening (PHQ9) on all patients, an Epworth questionnaire, sleep habits questionnaire, patient life and health improvement goals questionnaire. These will all be scanned into the patient's chart under the "media" tab.   Review of Systems: Please refer to new patient packet scanned into media. Pertinent positives were addressed with patient today.  Reviewed by clinician on day of visit: allergies, medications, problem list, medical history, surgical history, family history, social history, and previous encounter  notes.  During the visit, I independently reviewed the patient's EKG, bioimpedance scale results, and indirect calorimeter results. I used this information to tailor a meal plan for the patient that will help Summer Flores to lose weight and will improve her obesity-related conditions going forward.  I performed a medically necessary appropriate examination and/or evaluation. I discussed the assessment and treatment plan with the patient. The patient was provided an opportunity to ask questions and all were answered. The patient agreed with the plan and demonstrated an understanding of the instructions. Labs were ordered today (unless patient declined them) and will be reviewed with the patient at our next visit unless more critical results need to be addressed immediately. Clinical information was updated and documented  in the EMR.   Objective:   PHYSICAL EXAM: Blood pressure 135/84, pulse 84, temperature 98.6 F (37 C), height 5\' 6"  (1.676 m), weight 276 lb (125.2 kg), SpO2 96%. Body mass index is 44.55 kg/m.  General: Well Developed, well nourished, and in no acute distress.  HEENT: Normocephalic, atraumatic; EOMI, sclerae are anicteric. Skin: Warm and dry, good turgor Chest:  Normal excursion, shape, no gross ABN Respiratory: No conversational dyspnea; speaking in full sentences NeuroM-Sk:  Normal gross ROM * 4 extremities  Psych: A and O *3, insight adequate, mood- full   Anthropometric Measurements Height: 5\' 6"  (1.676 m) Weight: 276 lb (125.2 kg) BMI (Calculated): 44.57 Starting Weight: 276lb   Body Composition  Body Fat %: 50.4 % Fat Mass (lbs): 139.4 lbs Muscle Mass (lbs): 130 lbs Total Body Water (lbs): 92.2 lbs Visceral Fat Rating : 14   Other Clinical Data Fasting: Yes Labs: Yes Today's Visit #: 1 Starting Date: 09/13/23   DIAGNOSTIC DATA REVIEWED:  BMET    Component Value Date/Time   NA 141 10/06/2020 1158   K 4.3 10/06/2020 1158   CL 101 10/06/2020 1158    CO2 20 10/06/2020 1158   GLUCOSE 80 10/06/2020 1158   GLUCOSE 113 (H) 09/21/2020 1929   BUN 11 10/06/2020 1158   CREATININE 0.69 10/06/2020 1158   CALCIUM 9.5 10/06/2020 1158   GFRNONAA >60 09/21/2020 1929   GFRAA >60 04/07/2015 2105   No results found for: "HGBA1C" No results found for: "INSULIN" Lab Results  Component Value Date   TSH 1.58 03/01/2021   CBC    Component Value Date/Time   WBC 8.2 10/06/2020 1158   WBC 11.2 (H) 09/21/2020 1929   RBC 4.96 10/06/2020 1158   RBC 5.01 09/21/2020 1929   HGB 12.1 10/06/2020 1158   HCT 38.7 10/06/2020 1158   PLT 250 10/06/2020 1158   MCV 78 (L) 10/06/2020 1158   MCH 24.4 (L) 10/06/2020 1158   MCH 24.8 (L) 09/21/2020 1929   MCHC 31.3 (L) 10/06/2020 1158   MCHC 32.2 09/21/2020 1929   RDW 13.5 10/06/2020 1158   Iron Studies No results found for: "IRON", "TIBC", "FERRITIN", "IRONPCTSAT" Lipid Panel  No results found for: "CHOL", "TRIG", "HDL", "CHOLHDL", "VLDL", "LDLCALC", "LDLDIRECT" Hepatic Function Panel     Component Value Date/Time   PROT 7.0 10/06/2020 1158   ALBUMIN 4.2 10/06/2020 1158   AST 17 10/06/2020 1158   ALT 22 10/06/2020 1158   ALKPHOS 75 10/06/2020 1158   BILITOT 0.3 10/06/2020 1158      Component Value Date/Time   TSH 1.58 03/01/2021 0900   Nutritional No results found for: "VD25OH"  Attestation Statements:   I, Special Puri, acting as a Stage manager for [employer], DO., have compiled all relevant documentation for today's office visit on behalf of Thomasene Lot, DO, while in the presence of [employer], DO.  Reviewed by clinician on day of visit: allergies, medications, problem list, medical history, surgical history, family history, social history, and previous encounter notes pertinent to patient's obesity diagnosis. I have spent 72  minutes in the care of the patient today including: preparing to see patient (e.g. review and interpretation of tests, old notes ), obtaining and/or  reviewing separately obtained history, performing a medically appropriate examination or evaluation, counseling and educating the patient, ordering medications, test or procedures, documenting clinical information in the electronic or other health care record, and independently interpreting results and communicating results to the patient, family, or caregiver  I have reviewed the above documentation for accuracy and completeness, and I agree with the above. Carlye Grippe, D.O.  The 21st Century Cures Act was signed into law in 2016 which includes the topic of electronic health records.  This provides immediate access to information in MyChart.  This includes consultation notes, operative notes, office notes, lab results and pathology reports.  If you have any questions about what you read please let us know at your next visit so we can discuss your concerns and take corrective action if need be.  We are right here with you.

## 2023-09-14 LAB — CBC WITH DIFFERENTIAL/PLATELET
Basophils Absolute: 0.1 10*3/uL (ref 0.0–0.2)
Basos: 1 %
EOS (ABSOLUTE): 0.1 10*3/uL (ref 0.0–0.4)
Eos: 1 %
Hematocrit: 38.7 % (ref 34.0–46.6)
Hemoglobin: 12.5 g/dL (ref 11.1–15.9)
Immature Grans (Abs): 0 10*3/uL (ref 0.0–0.1)
Immature Granulocytes: 0 %
Lymphocytes Absolute: 3.2 10*3/uL — ABNORMAL HIGH (ref 0.7–3.1)
Lymphs: 34 %
MCH: 27.1 pg (ref 26.6–33.0)
MCHC: 32.3 g/dL (ref 31.5–35.7)
MCV: 84 fL (ref 79–97)
Monocytes Absolute: 0.4 10*3/uL (ref 0.1–0.9)
Monocytes: 4 %
Neutrophils Absolute: 5.5 10*3/uL (ref 1.4–7.0)
Neutrophils: 60 %
Platelets: 264 10*3/uL (ref 150–450)
RBC: 4.61 x10E6/uL (ref 3.77–5.28)
RDW: 12.9 % (ref 11.7–15.4)
WBC: 9.2 10*3/uL (ref 3.4–10.8)

## 2023-09-14 LAB — COMPREHENSIVE METABOLIC PANEL
ALT: 17 [IU]/L (ref 0–32)
AST: 18 [IU]/L (ref 0–40)
Albumin: 4.2 g/dL (ref 3.9–4.9)
Alkaline Phosphatase: 66 [IU]/L (ref 44–121)
BUN/Creatinine Ratio: 17 (ref 9–23)
BUN: 11 mg/dL (ref 6–20)
Bilirubin Total: 0.3 mg/dL (ref 0.0–1.2)
CO2: 20 mmol/L (ref 20–29)
Calcium: 9.4 mg/dL (ref 8.7–10.2)
Chloride: 99 mmol/L (ref 96–106)
Creatinine, Ser: 0.64 mg/dL (ref 0.57–1.00)
Globulin, Total: 2.3 g/dL (ref 1.5–4.5)
Glucose: 79 mg/dL (ref 70–99)
Potassium: 4.3 mmol/L (ref 3.5–5.2)
Sodium: 137 mmol/L (ref 134–144)
Total Protein: 6.5 g/dL (ref 6.0–8.5)
eGFR: 120 mL/min/{1.73_m2} (ref >59)

## 2023-09-14 LAB — VITAMIN B12: Vitamin B-12: 381 pg/mL (ref 232–1245)

## 2023-09-14 LAB — FOLATE: Folate: 3.6 ng/mL (ref >3.0)

## 2023-09-14 LAB — TSH: TSH: 3.05 u[IU]/mL (ref 0.450–4.500)

## 2023-09-14 LAB — LIPID PANEL
Chol/HDL Ratio: 3.9 {ratio} (ref 0.0–4.4)
Cholesterol, Total: 202 mg/dL — ABNORMAL HIGH (ref 100–199)
HDL: 52 mg/dL (ref >39)
LDL Chol Calc (NIH): 110 mg/dL — ABNORMAL HIGH (ref 0–99)
Triglycerides: 232 mg/dL — ABNORMAL HIGH (ref 0–149)
VLDL Cholesterol Cal: 40 mg/dL (ref 5–40)

## 2023-09-14 LAB — VITAMIN D 25 HYDROXY (VIT D DEFICIENCY, FRACTURES): Vit D, 25-Hydroxy: 48.6 ng/mL (ref 30.0–100.0)

## 2023-09-14 LAB — HEMOGLOBIN A1C
Est. average glucose Bld gHb Est-mCnc: 97 mg/dL
Hgb A1c MFr Bld: 5 % (ref 4.8–5.6)

## 2023-09-14 LAB — T4, FREE: Free T4: 1.03 ng/dL (ref 0.82–1.77)

## 2023-09-14 LAB — T3: T3, Total: 206 ng/dL — ABNORMAL HIGH (ref 71–180)

## 2023-09-14 LAB — INSULIN, RANDOM: INSULIN: 15.5 u[IU]/mL (ref 2.6–24.9)

## 2023-09-27 ENCOUNTER — Ambulatory Visit (INDEPENDENT_AMBULATORY_CARE_PROVIDER_SITE_OTHER): Payer: Managed Care, Other (non HMO) | Admitting: Family Medicine

## 2023-09-27 ENCOUNTER — Encounter (INDEPENDENT_AMBULATORY_CARE_PROVIDER_SITE_OTHER): Payer: Self-pay | Admitting: Family Medicine

## 2023-09-27 VITALS — BP 123/79 | HR 93 | Temp 98.3°F | Ht 66.0 in | Wt 271.0 lb

## 2023-09-27 DIAGNOSIS — F5089 Other specified eating disorder: Secondary | ICD-10-CM

## 2023-09-27 DIAGNOSIS — F39 Unspecified mood [affective] disorder: Secondary | ICD-10-CM

## 2023-09-27 DIAGNOSIS — E538 Deficiency of other specified B group vitamins: Secondary | ICD-10-CM

## 2023-09-27 DIAGNOSIS — E782 Mixed hyperlipidemia: Secondary | ICD-10-CM | POA: Diagnosis not present

## 2023-09-27 DIAGNOSIS — Z6841 Body Mass Index (BMI) 40.0 and over, adult: Secondary | ICD-10-CM

## 2023-09-27 DIAGNOSIS — E88819 Insulin resistance, unspecified: Secondary | ICD-10-CM

## 2023-09-27 DIAGNOSIS — R7989 Other specified abnormal findings of blood chemistry: Secondary | ICD-10-CM

## 2023-09-27 DIAGNOSIS — E559 Vitamin D deficiency, unspecified: Secondary | ICD-10-CM

## 2023-09-27 MED ORDER — VITAMIN D (ERGOCALCIFEROL) 1.25 MG (50000 UNIT) PO CAPS: 50000.0000 [IU] | ORAL_CAPSULE | ORAL | 0 refills | Status: DC

## 2023-09-27 MED ORDER — B-12 2000 MCG PO TABS: 2000.0000 ug | ORAL_TABLET | Freq: Every day | ORAL | 0 refills | Status: DC

## 2023-09-27 NOTE — Progress Notes (Signed)
 Summer Flores, D.O.  ABFM, ABOM Clinical Bariatric Medicine Physician  Office located at: [address] [place], [place]  [address]     Assessment and Plan:   FOR THE DISEASE OF OBESITY: Morbid obesity (HCC), Starting BMI 44.57 BMI 40.0-44.9, adult -- Current BMI 43.76 Assessment & Plan: Since last office visit on 09/13/23 patient's muscle mass has decreased by 0.6 lb. Fat mass has decreased by 4.2 lb. Total body water has decreased by 1.4 lb.  Counseling done on how various foods will affect these numbers and how to maximize success  Total lbs lost to date: 5 lbs Total weight loss percentage to date: -1.81%   Recommended Dietary Goals Summer Flores is currently in the action stage of change. As such, her goal is to continue weight management plan.  She has agreed to:  Follow the Category 2 Plan with L options and 6 ounces of protein at lunch and 8-10 ounces at dinner and add breakfast options.   Behavioral Intervention We discussed the following Behavioral Modification Strategies today: increasing lean protein intake to established goals, decreasing simple carbohydrates , increasing vegetables, increasing lower glycemic fruits, avoiding skipping meals, and keeping healthy foods at home  Additional resources provided today: Handout on CAT 1-2 breakfast options  Evidence-based interventions for health behavior change were utilized today including the discussion of self monitoring techniques, problem-solving barriers and SMART goal setting techniques.   Regarding patient's less desirable eating habits and patterns, we employed the technique of small changes.   Pt will specifically work on: Work on increasing her protein intake at breakfast for next visit.    Recommended Physical Activity Goals Summer Flores has been advised to work up to 150 minutes of moderate intensity aerobic activity a week and strengthening exercises 2-3 times per week for cardiovascular health, weight loss  maintenance and preservation of muscle mass.   She has agreed to :  Think about enjoyable ways to increase daily physical activity and overcoming barriers to exercise and Increase physical activity in their day and reduce sedentary time (increase NEAT).   Pharmacotherapy We discussed various medication options to help Summer Flores with her weight loss efforts and we both agreed to : continue with nutritional and behavioral strategies   FOR ASSOCIATED CONDITIONS ADDRESSED TODAY: Mood disorder (HCC) - Emotional Eating Assessment & Plan: Moods are stable today - no acute concerns. Denies any SI/HI. She is currently on Zoloft 25 mg once daily and Xanax prn. Good tolerance with no SE reported. At her last OV pt was referred to Dr. Dewaine Conger and has an upcoming appt next week on 10/02/23. Continue with current regimen as prescribed. No changes were made today. Will continue to monitor condition alongside PCP/specialists.    Insulin Resistance: Lab Results  Component Value Date   HGBA1C 5.0 09/13/2023   INSULIN 15.5 09/13/2023  Most recent A1C was 5.0 and insulin was 15.5. Not on meds currently. Diet/exercise approach. She has been working on getting back on track with her meal plan in the last week as she recovers from Covid. Reviewed ideal goal for insulin of 5 or less. Patient counseled on pathophysiology of the disease process of insulin resistance. Discussed how insulin resistance can progress to prediabetes if not properly managed or controlled. Advised pt to increase lower glycemic fruits (berries) and increase protein intake. Will continue to monitor condition.    Mixed hyperlipidemia Assessment & Plan: Lab Results  Component Value Date   CHOL 202 (H) 09/13/2023   HDL 52 09/13/2023  LDLCALC 110 (H) 09/13/2023   TRIG 232 (H) 09/13/2023   CHOLHDL 3.9 09/13/2023  Results from most recent lipid panel on 09/13/2023 showed elevated triglycerides at 232 and LDL at 110. Encouraged pt to avoid any fatty  meats, butters, and saturated/trans fats. Increase her daily intake of lean proteins and follow heart healthy diet via her nutritional meal plan. Advised pt to start exercising in an effort to improve her LDL and TGs. Will continue to monitor alongside PCP/specialists.    Vitamin D deficiency Assessment & Plan: Lab Results  Component Value Date   VD25OH 48.6 09/13/2023  Vitamin D is essentially at goal. She is currently compliant with ERGO 50K units once weekly. Tolerating well with no adverse SE. Continue with current supplementation. Will refill ERGO today. No changes made today. Will continue to monitor condition.   Orders: - Refill ERGO, no dose changes.    B12 deficiency Lab Results  Component Value Date   VITAMINB12 381 09/13/2023  Pt is currently taking B12 1,000 mcg once daily. She does report about 10 missed doses in the last 3 months, but has otherwise been compliant with B12 supplementation. Her mother and maternal grandmother both have hx of B12 deficiency. Ideal B12 goal of 500 or more reviewed with pt, she verbalized understanding. We mutually agreed to increase her B12 to 2,000 mcg once daily.  Orders: - Increase B12 to 2,000 mcg once daily    Abnormal thyroid blood test Lab Results  Component Value Date   TSH 3.050 09/13/2023   T3TOTAL 206 (H) 09/13/2023  As of last obtained labs on 09/13/23, her T3 was elevated at 206. Pt denies any new symptoms of hyperthyroidism. She does reports excessive anxiety, but notes this is not new. I recommend she follow up with her PCP to further discuss her elevated T3 levels. Will continue to monitor condition alongside PCP as it relates to her weight loss journey.    FOLLOW UP:   Return in about 18 days (around 10/15/2023) for Keep upcoming appt, make another appt 2-3 weeks after if pt desires. She was informed of the importance of frequent follow up visits to maximize her success with intensive lifestyle modifications for her multiple  health conditions.  Subjective:   Chief complaint: Obesity Summer Flores is here to discuss her progress with her obesity treatment plan. She is on the Category 2 Plan with L options and 6 ounces of protein at lunch and 8-10 ounces at dinner and states she is following her eating plan approximately 20% of the time. She states she is not exercising.  Interval History:  Summer Flores is here today for her first follow-up office visit since starting the program with [place]. Patient is off to a good start and has lost 5 lbs. She has struggled to follow her meal plan due to being ill with Covid soon after her last appointment. She just started following her meal plan closely about 4 days ago. Has been measuring her protein using her food scale. On days she is unable to eat all her protein for breakfast in one sitting, she will eat the rest of her protein as a snack between breakfast and lunch. She has been using her slow cooker in the last week to make on-plan meals.   All blood work/ lab tests that were recently ordered by myself or an outside provider were reviewed with patient today per their request. Extended time was spent counseling her on all new disease processes that were discovered or  preexisting ones that are affected by BMI.  she understands that many of these abnormalities will need to monitored regularly along with the current treatment plan of prudent dietary changes, in which we are making each and every office visit, to improve these health parameters.  Pharmacotherapy for weight loss: She is not currently taking medications  for medical weight loss.  Denies side effects.    Review of Systems:  Pertinent positives were addressed with patient today.  Reviewed by clinician on day of visit: allergies, medications, problem list, medical history, surgical history, family history, social history, and previous encounter notes.   Weight Summary and Biometrics   Weight Lost Since Last Visit: 5  lb  Weight Gained Since Last Visit: 0   Vitals Temp: 98.3 F (36.8 C) BP: 123/79 Pulse Rate: 93 SpO2: 96 %   Anthropometric Measurements Height: 5\' 6"  (1.676 m) Weight: 271 lb (122.9 kg) BMI (Calculated): 43.76 Weight at Last Visit: 276 lb Weight Lost Since Last Visit: 5 lb Weight Gained Since Last Visit: 0 Starting Weight: 276 lb Total Weight Loss (lbs): 5 lb (2.268 kg)   Body Composition  Body Fat %: 49.8 % Fat Mass (lbs): 135.2 lbs Muscle Mass (lbs): 129.4 lbs Total Body Water (lbs): 90.8 lbs Visceral Fat Rating : 14   Other Clinical Data Fasting: no Labs: no Today's Visit #: 2 Starting Date: 09/13/23     Objective:   PHYSICAL EXAM:  Blood pressure 123/79, pulse 93, temperature 98.3 F (36.8 C), height 5\' 6"  (1.676 m), weight 271 lb (122.9 kg), SpO2 96%. Body mass index is 43.74 kg/m.  General: she is overweight, cooperative and in no acute distress.   HEENT: EOMI, sclerae are anicteric. Lungs: Normal breathing effort, no conversational dyspnea. M-Sk:  Normal gross ROM * 4 extremities  PSYCH: Has normal mood, affect and thought process. Neurologic: No gross sensory or motor deficits. Well developed, A and O * 3  DIAGNOSTIC DATA REVIEWED:  BMET    Component Value Date/Time   NA 137 09/13/2023 1012   K 4.3 09/13/2023 1012   CL 99 09/13/2023 1012   CO2 20 09/13/2023 1012   GLUCOSE 79 09/13/2023 1012   GLUCOSE 113 (H) 09/21/2020 1929   BUN 11 09/13/2023 1012   CREATININE 0.64 09/13/2023 1012   CALCIUM 9.4 09/13/2023 1012   GFRNONAA >60 09/21/2020 1929   GFRAA >60 04/07/2015 2105   Lab Results  Component Value Date   HGBA1C 5.0 09/13/2023   Lab Results  Component Value Date   INSULIN 15.5 09/13/2023   Lab Results  Component Value Date   TSH 3.050 09/13/2023   CBC    Component Value Date/Time   WBC 9.2 09/13/2023 1012   WBC 11.2 (H) 09/21/2020 1929   RBC 4.61 09/13/2023 1012   RBC 5.01 09/21/2020 1929   HGB 12.5 09/13/2023 1012    HCT 38.7 09/13/2023 1012   PLT 264 09/13/2023 1012   MCV 84 09/13/2023 1012   MCH 27.1 09/13/2023 1012   MCH 24.8 (L) 09/21/2020 1929   MCHC 32.3 09/13/2023 1012   MCHC 32.2 09/21/2020 1929   RDW 12.9 09/13/2023 1012   Iron Studies No results found for: "IRON", "TIBC", "FERRITIN", "IRONPCTSAT" Lipid Panel     Component Value Date/Time   CHOL 202 (H) 09/13/2023 1012   TRIG 232 (H) 09/13/2023 1012   HDL 52 09/13/2023 1012   CHOLHDL 3.9 09/13/2023 1012   LDLCALC 110 (H) 09/13/2023 1012   Hepatic Function Panel  Component Value Date/Time   PROT 6.5 09/13/2023 1012   ALBUMIN 4.2 09/13/2023 1012   AST 18 09/13/2023 1012   ALT 17 09/13/2023 1012   ALKPHOS 66 09/13/2023 1012   BILITOT 0.3 09/13/2023 1012      Component Value Date/Time   TSH 3.050 09/13/2023 1012   Nutritional Lab Results  Component Value Date   VD25OH 48.6 09/13/2023    Attestations:   Reviewed by clinician on day of visit: allergies, medications, problem list, medical history, surgical history, family history, social history, and previous encounter notes pertinent to patient's obesity diagnosis.   I have spent 52 minutes in the care of the patient today including: preparing to see patient (e.g. review and interpretation of tests, old notes ), obtaining and/or reviewing separately obtained history, performing a medically appropriate examination or evaluation, counseling and educating the patient, ordering medications, test or procedures, documenting clinical information in the electronic or other health care record, and independently interpreting results and communicating results to the patient, family, or caregiver   I, Isabelle Course, acting as a medical scribe for Thomasene Lot, DO., have compiled all relevant documentation for today's office visit on behalf of Thomasene Lot, DO, while in the presence of [employer], DO.  I have reviewed the above documentation for accuracy and completeness,  and I agree with the above. Summer Flores, D.O.  The 21st Century Cures Act was signed into law in 2016 which includes the topic of electronic health records.  This provides immediate access to information in MyChart.  This includes consultation notes, operative notes, office notes, lab results and pathology reports.  If you have any questions about what you read please let us know at your next visit so we can discuss your concerns and take corrective action if need be.  We are right here with you.

## 2023-10-02 ENCOUNTER — Telehealth (INDEPENDENT_AMBULATORY_CARE_PROVIDER_SITE_OTHER): Payer: Managed Care, Other (non HMO) | Admitting: Psychology

## 2023-10-15 ENCOUNTER — Encounter (INDEPENDENT_AMBULATORY_CARE_PROVIDER_SITE_OTHER): Payer: Self-pay | Admitting: Adult Health

## 2023-10-15 ENCOUNTER — Ambulatory Visit (INDEPENDENT_AMBULATORY_CARE_PROVIDER_SITE_OTHER): Payer: Managed Care, Other (non HMO) | Admitting: Adult Health

## 2023-10-15 VITALS — BP 112/83 | HR 104 | Temp 98.0°F | Ht 66.0 in | Wt 276.0 lb

## 2023-10-15 DIAGNOSIS — E782 Mixed hyperlipidemia: Secondary | ICD-10-CM | POA: Diagnosis not present

## 2023-10-15 DIAGNOSIS — E559 Vitamin D deficiency, unspecified: Secondary | ICD-10-CM

## 2023-10-15 DIAGNOSIS — E669 Obesity, unspecified: Secondary | ICD-10-CM | POA: Diagnosis not present

## 2023-10-15 DIAGNOSIS — Z6841 Body Mass Index (BMI) 40.0 and over, adult: Secondary | ICD-10-CM

## 2023-10-15 DIAGNOSIS — E538 Deficiency of other specified B group vitamins: Secondary | ICD-10-CM | POA: Diagnosis not present

## 2023-10-15 MED ORDER — B-12 2000 MCG PO TABS: 2000.0000 ug | ORAL_TABLET | Freq: Every day | ORAL | 0 refills | Status: AC

## 2023-10-15 MED ORDER — VITAMIN D (ERGOCALCIFEROL) 1.25 MG (50000 UNIT) PO CAPS: 50000.0000 [IU] | ORAL_CAPSULE | ORAL | 0 refills | Status: AC

## 2023-10-15 NOTE — Progress Notes (Signed)
 WEIGHT SUMMARY AND BIOMETRICS  Vitals Temp: 98 F (36.7 C) BP: 112/83 Pulse Rate: (!) 104 SpO2: 99 %   Anthropometric Measurements Height: 5\' 6"  (1.676 m) Weight: 276 lb (125.2 kg) BMI (Calculated): 44.57 Weight at Last Visit: 271 lb Weight Lost Since Last Visit: 0 lb Weight Gained Since Last Visit: 5 lb Starting Weight: 276 lb Total Weight Loss (lbs): 0 lb (0 kg)   Body Composition  Body Fat %: 51.2 % Fat Mass (lbs): 141.4 lbs Muscle Mass (lbs): 128 lbs Total Body Water (lbs): 97.2 lbs Visceral Fat Rating : 15   Other Clinical Data RMR: [id number] Fasting: no Labs: no Today's Visit #: 3 Starting Date: 09/13/23    Chief Complaint:   OBESITY Summer Flores is here to discuss her progress with her obesity treatment plan.  She is on the the Category 2 Plan and states she is following her eating plan approximately 75 % of the time.  She states she is exercising dance 60 minutes 1 times per week.  Interim History:  In the [place] Year: She has moved into her boyfriends home with his two children, age 60 (boy), age 66 (girl) They have the boys around 80% Her cousin, age 27 (girl) also recently moved into their home.  Summer Flores has been challenged to meal plan and prep for this growing family.  They will often travel on weekends for Dance Competitions. She feels that she can easily follow Cat 2 Meal Plan M-F, challenged to follow during S/S  Subjective:   1. B12 deficiency 09/27/2023 - Increased B12 to 2,000 mcg once daily  Goal B12 level is <500 She endorses stable energy levels  2. Vitamin D deficiency  Latest Reference Range & Units 03/22/21 16:31 09/13/23 10:12  CRP 0 - 10 mg/L 25 (H)   Vitamin D, 25-Hydroxy 30.0 - 100.0 ng/mL  48.6  (H): Data is abnormally high  She is on weekly Ergocalciferol- denies N/V/Muscle Weakness She endorses stable energy levels  3. Mixed hyperlipidemia Lipid Panel     Component Value Date/Time   CHOL 202 (H) 09/13/2023 1012    TRIG 232 (H) 09/13/2023 1012   HDL 52 09/13/2023 1012   CHOLHDL 3.9 09/13/2023 1012   LDLCALC 110 (H) 09/13/2023 1012   LABVLDL 40 09/13/2023 1012   She denies CP with exertion. She is not on statin therapy  Assessment/Plan:   1. B12 deficiency Refill - Cyanocobalamin (B-12) 2000 MCG TABS; Take 2,000 mcg by mouth daily.  Dispense: 30 tablet; Refill: 0  2. Vitamin D deficiency Refill - Vitamin D, Ergocalciferol, (DRISDOL) 1.25 MG (50000 UNIT) CAPS capsule; Take 1 capsule (50,000 Units total) by mouth once a week.  Dispense: 5 capsule; Refill: 0  3. Mixed hyperlipidemia (Primary) Limit sat fat Increase regular cardiovascular exercise  4. BMI 40.0-44.9, adult -- Current BMI 44.6  Summer Flores is currently in the action stage of change. As such, her goal is to continue with weight loss efforts. She has agreed to the Category 2 Plan. Journaling Plan on weekend- Calorie and Protein parameters provided for each meal  Exercise goals: All adults should avoid inactivity. Some physical activity is better than none, and adults who participate in any amount of physical activity gain some health benefits. Adults should also include muscle-strengthening activities that involve all major muscle groups on 2 or more days a week.  Behavioral modification strategies: increasing lean protein intake, decreasing simple carbohydrates, increasing vegetables, increasing water intake, decreasing liquid calories, no skipping meals,  meal planning and cooking strategies, keeping healthy foods in the home, ways to avoid boredom eating, planning for success, and keeping a strict food journal.  Summer Flores has agreed to follow-up with our clinic in 3 weeks. She was informed of the importance of frequent follow-up visits to maximize her success with intensive lifestyle modifications for her multiple health conditions.   Objective:   Blood pressure 112/83, pulse (!) 104, temperature 98 F (36.7 C), height 5\' 6"  (1.676 m),  weight 276 lb (125.2 kg), last menstrual period 10/01/2023, SpO2 99%. Body mass index is 44.55 kg/m.  General: Cooperative, alert, well developed, in no acute distress. HEENT: Conjunctivae and lids unremarkable. Cardiovascular: Regular rhythm.  Lungs: Normal work of breathing. Neurologic: No focal deficits.   Lab Results  Component Value Date   CREATININE 0.64 09/13/2023   BUN 11 09/13/2023   NA 137 09/13/2023   K 4.3 09/13/2023   CL 99 09/13/2023   CO2 20 09/13/2023   Lab Results  Component Value Date   ALT 17 09/13/2023   AST 18 09/13/2023   ALKPHOS 66 09/13/2023   BILITOT 0.3 09/13/2023   Lab Results  Component Value Date   HGBA1C 5.0 09/13/2023   Lab Results  Component Value Date   INSULIN 15.5 09/13/2023   Lab Results  Component Value Date   TSH 3.050 09/13/2023   Lab Results  Component Value Date   CHOL 202 (H) 09/13/2023   HDL 52 09/13/2023   LDLCALC 110 (H) 09/13/2023   TRIG 232 (H) 09/13/2023   CHOLHDL 3.9 09/13/2023   Lab Results  Component Value Date   VD25OH 48.6 09/13/2023   Lab Results  Component Value Date   WBC 9.2 09/13/2023   HGB 12.5 09/13/2023   HCT 38.7 09/13/2023   MCV 84 09/13/2023   PLT 264 09/13/2023   No results found for: "IRON", "TIBC", "FERRITIN"  Attestation Statements:   Reviewed by clinician on day of visit: allergies, medications, problem list, medical history, surgical history, family history, social history, and previous encounter notes.  I have reviewed the above documentation for accuracy and completeness, and I agree with the above. -  Lydell Moga d. Dominyk Law, NP-C

## 2023-10-22 ENCOUNTER — Telehealth (INDEPENDENT_AMBULATORY_CARE_PROVIDER_SITE_OTHER): Admitting: Psychology

## 2023-10-22 ENCOUNTER — Telehealth (INDEPENDENT_AMBULATORY_CARE_PROVIDER_SITE_OTHER): Payer: Self-pay | Admitting: Psychology

## 2023-10-22 NOTE — Progress Notes (Unsigned)
 Office: [phone]  /  Fax: [phone]    Date: October 22, 2023    Appointment Start Time: *** Duration: *** minutes Provider: Lawerance Cruel, Psy.D. Type of Session: Intake for Individual Therapy  Location of Patient: {gbptloc:23249} (private location) Location of Provider: Provider's home (private office) Type of Contact: Telepsychological Visit via MyChart Video Visit  Informed Consent: This provider called Summer Flores at 9:04am as she did not present for today's appointment. A HIPAA compliant voicemail was left requesting a call back.  As such, today's appointment was initiated *** minutes late.Prior to proceeding with today's appointment, two pieces of identifying information were obtained. In addition, Summer Flores's physical location at the time of this appointment was obtained as well a phone number she could be reached at in the event of technical difficulties. Summer Flores and this provider participated in today's telepsychological service.   The provider's role was explained to [employer]. The provider reviewed and discussed issues of confidentiality, privacy, and limits therein (e.g., reporting obligations). In addition to verbal informed consent, written informed consent for psychological services was obtained prior to the initial appointment. Since the clinic is not a 24/7 crisis center, mental health emergency resources were shared and this  provider explained MyChart, e-mail, voicemail, and/or other messaging systems should be utilized only for non-emergency reasons. This provider also explained that information obtained during appointments will be placed in [facility] medical record and relevant information will be shared with other providers at [facility] at any locations for coordination of care. Summer Flores agreed information may be shared with other [facility] providers as needed for coordination of care and by signing the service agreement document, she provided written  consent for coordination of care. Prior to initiating telepsychological services, Summer Flores completed an informed consent document, which included the development of a safety plan (i.e., an emergency contact and emergency resources) in the event of an emergency/crisis. Summer Flores verbally acknowledged understanding she is ultimately responsible for understanding her insurance benefits for telepsychological and in-person services. This provider also reviewed confidentiality, as it relates to telepsychological services. Summer Flores  acknowledged understanding that appointments cannot be recorded without both party consent and she is aware she is responsible for securing confidentiality on her end of the session. Summer Flores verbally consented to proceed.  Chief Complaint/HPI: Clorinda was referred by Dr. Thomasene Lot due to mood disorder-emotional eating. Per the note for the visit with Dr. Thomasene Lot on 09/13/2023, "Relevant meds: Zoloft 25 mg daily and Xanax prn. Summer Flores endorses having illness anxiety which began during COVID. Mood is stable today - no acute concerns. She denies having a Careers information officer. She tends to eat when sad, bored, to comfort herself, and/or as a reward. Pt was referred to Dr. Dewaine Conger. Consider obtaining counselor in the future. Continue med regimen. Begin meal plan."  During today's appointment, Damary was verbally administered a questionnaire assessing various behaviors related to emotional eating behaviors. Aleeza endorsed the following: {gbmoodandfood:21755}. She shared she craves ***. Summer Flores believes the onset of emotional eating behaviors was *** and described the current frequency of emotional eating behaviors as ***. In addition, Summer Flores {gblegal:22371} a history of binge eating behaviors. *** Currently, Summer Flores indicated ***. Furthermore, Summer Flores {gblegal:22371} other problems of concern. ***   Mental Status Examination:  Appearance: {Appearance:22431} Behavior: {Behavior:22445} Mood:  {gbmood:21757} Affect: {Affect:22436} Speech: {Speech:22432} Eye Contact: {Eye Contact:22433} Psychomotor Activity: {Motor Activity:22434} Gait: unable to assess  Thought Process: {thought process:22448}  Thought Content/Perception: {disturbances:22451} Orientation: {Orientation:22437} Memory/Concentration: {gbcognition:22449} Insight/Judgment: {Insight:22446}  Family &  Psychosocial History: Summer Flores reported she is *** and ***. She indicated she is currently ***. Additionally, Summer Flores shared her highest level of education obtained is ***. Currently, Summer Flores's social support system consists of her ***. Moreover, Summer Flores stated she resides with her ***.   Medical History:  Past Medical History:  Diagnosis Date   Angio-edema    Anxiety    Atypical chest pain 09/10/2016   Atypical nevus 09/12/2013   left forehead (severe), Right mid back (mild), right lower back (mild)   Atypical nevus 09/09/2014   right outer abdomen (mild), right side neck (mild), left upper arm (moderate),    Atypical nevus 09/24/2015   left anterior shin (moderate), left outer back (moderate)   Atypical nevus 09/26/2017   right back outer (mild), left lower thigh (severe), left upper thigh (moderate), left chest (moderate)   Atypical nevus 09/17/2018   right mid paraspinal (moderate), right inner shin (moderate)   B12 deficiency    GERD (gastroesophageal reflux disease)    Hypertension    Insulin resistance    Palpitations    Shortness of breath 09/10/2016   Vitamin D deficiency    Past Surgical History:  Procedure Laterality Date   ADENOIDECTOMY     FINGER SURGERY     MYRINGOTOMY     SKIN SURGERY     TONSILLECTOMY     Current Outpatient Medications on File Prior to Visit  Medication Sig Dispense Refill   ALPRAZolam (XANAX) 0.5 MG tablet Take by mouth as needed.     Ascorbic Acid (VITAMIN C) 500 MG CAPS      cephALEXin (KEFLEX) 500 MG capsule as needed.     Cyanocobalamin (B-12) 2000 MCG TABS Take 2,000 mcg  by mouth daily. 30 tablet 0   Ferrous Sulfate (IRON) 325 (65 Fe) MG TABS 1 tablet     loratadine (CLARITIN) 10 MG tablet Take 10 mg by mouth daily.     losartan-hydrochlorothiazide (HYZAAR) 100-25 MG tablet Take 1 tablet by mouth daily.     norethindrone-ethinyl estradiol 1/35 (NORTREL 1/35, 28,) tablet See admin instructions.     pantoprazole (PROTONIX) 40 MG tablet pantoprazole 40 mg tablet,delayed release  TAKE 1 TABLET BY MOUTH DAILY     sertraline (ZOLOFT) 25 MG tablet Take 25 mg by mouth daily.     Vitamin D, Ergocalciferol, (DRISDOL) 1.25 MG (50000 UNIT) CAPS capsule Take 1 capsule (50,000 Units total) by mouth once a week. 5 capsule 0   No current facility-administered medications on file prior to visit.    Mental Health History: Irvin reported ***. She {gblegal:22371} a history of psychotropic medications. Kya {Endorse or deny of item:23407} hospitalizations for psychiatric concerns. Dyllan {gblegal:22371} a family history of mental health/substance abuse related concerns. *** Tritia {Endorse or deny of item:23407} trauma including {gbtrauma:22071} abuse, as well as neglect. Summer Flores described her typical mood lately as ***. Aside from concerns noted above and endorsed on the PHQ-9 and GAD-7, Leannah reported ***. Nia {gblegal:22371} current alcohol use. *** She {gblegal:22371} tobacco use. *** She {gblegal:22371} illicit/recreational substance use. Furthermore, Summer Flores indicated she is not experiencing the following: {gbsxs:21965}. She also denied history of and current suicidal ideation, plan, and intent; history of and current homicidal ideation, plan, and intent; and history of and current engagement in self-harm.  Legal History: Aniesa {Endorse or deny of item:23407} legal involvement.   Structured Assessments Results: The Patient Health Questionnaire-9 (PHQ-9) is a self-report measure that assesses symptoms and severity of depression over the course of the  last two weeks. Luda  obtained a score of *** suggesting {GBPHQ9SEVERITY:21752}. Haevyn finds the endorsed symptoms to be {gbphq9difficulty:21754}. [0= Not at all; 1= Several days; 2= More than half the days; 3= Nearly every day] Little interest or pleasure in doing things ***  Feeling down, depressed, or hopeless ***  Trouble falling or staying asleep, or sleeping too much ***  Feeling tired or having little energy ***  Poor appetite or overeating ***  Feeling bad about yourself --- or that you are a failure or have let yourself or your family down ***  Trouble concentrating on things, such as reading the newspaper or watching television ***  Moving or speaking so slowly that other people could have noticed? Or the opposite --- being so fidgety or restless that you have been moving around a lot more than usual ***  Thoughts that you would be better off dead or hurting yourself in some way ***  PHQ-9 Score ***    The Generalized Anxiety Disorder-7 (GAD-7) is a brief self-report measure that assesses symptoms of anxiety over the course of the last two weeks. Carmellia obtained a score of *** suggesting {gbgad7severity:21753}. Jolane finds the endorsed symptoms to be {gbphq9difficulty:21754}. [0= Not at all; 1= Several days; 2= Over half the days; 3= Nearly every day] Feeling nervous, anxious, on edge ***  Not being able to stop or control worrying ***  Worrying too much about different things ***  Trouble relaxing ***  Being so restless that it's hard to sit still ***  Becoming easily annoyed or irritable ***  Feeling afraid as if something awful might happen ***  GAD-7 Score ***   Interventions:  {Interventions List for Intake:23406}  Diagnostic Impressions & Provisional DSM-5 Diagnosis(es): Based on the aforementioned, the following diagnosis(es) were assigned: {Diagnoses:22752}.  Plan: Kiylah appears able and willing to participate as evidenced by engagement in reciprocal conversation and asking questions as  needed for clarification. The next appointment is scheduled for *** at ***, which will be via MyChart Video Visit. The following treatment goal was established: {gbtxgoals:21759}. This provider will regularly review the treatment plan and medical chart to keep informed of status changes. Brandalyn expressed understanding and agreement with the initial treatment plan of care. *** Ashliegh will be sent a handout via e-mail to utilize between now and the next appointment to increase awareness of hunger patterns and subsequent eating. Summer Flores provided verbal consent during today's appointment for this provider to send the handout via e-mail. ***   Lawerance Cruel, PsyD

## 2023-10-22 NOTE — Telephone Encounter (Signed)
  Office: [phone]  /  Fax: [phone]  Date of Call: October 22, 2023  Time of Call: 9:04am Provider: Lawerance Cruel, PsyD  CONTENT: This provider called Summer Flores to check-in as she did not present for today's MyChart Video Visit appointment. A HIPAA compliant voicemail was left requesting a call back. Of note, this provider stayed on the MyChart Video Visit appointment for 5 minutes prior to signing off per the clinic's grace period policy.    PLAN: This provider will wait for Summer Flores to call back. No further follow-up planned by this provider.

## 2023-11-05 ENCOUNTER — Ambulatory Visit (INDEPENDENT_AMBULATORY_CARE_PROVIDER_SITE_OTHER): Payer: Managed Care, Other (non HMO) | Admitting: Family Medicine

## 2023-11-26 ENCOUNTER — Ambulatory Visit (INDEPENDENT_AMBULATORY_CARE_PROVIDER_SITE_OTHER): Payer: Managed Care, Other (non HMO) | Admitting: Family Medicine
